# Patient Record
Sex: Female | Born: 1990 | Hispanic: Yes | Marital: Single | Smoking: Never smoker
Health system: Southern US, Community
[De-identification: ages and names within clinical notes are randomized; demographics above are authoritative.]

## PROBLEM LIST (undated history)

## (undated) ENCOUNTER — Inpatient Hospital Stay (HOSPITAL_COMMUNITY): Payer: Self-pay

## (undated) DIAGNOSIS — J302 Other seasonal allergic rhinitis: Secondary | ICD-10-CM

## (undated) DIAGNOSIS — K8020 Calculus of gallbladder without cholecystitis without obstruction: Secondary | ICD-10-CM

## (undated) HISTORY — PX: CHOLECYSTECTOMY: SHX55

---
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
## 2009-04-18 ENCOUNTER — Emergency Department (HOSPITAL_COMMUNITY): Admission: EM | Admit: 2009-04-18 | Discharge: 2009-04-18 | Payer: Self-pay | Admitting: Emergency Medicine

## 2009-09-19 ENCOUNTER — Emergency Department (HOSPITAL_COMMUNITY): Admission: EM | Admit: 2009-09-19 | Discharge: 2009-09-19 | Payer: Self-pay | Admitting: Emergency Medicine

## 2009-10-03 ENCOUNTER — Emergency Department (HOSPITAL_COMMUNITY): Admission: EM | Admit: 2009-10-03 | Discharge: 2009-10-03 | Payer: Self-pay | Admitting: Emergency Medicine

## 2009-12-25 ENCOUNTER — Encounter (INDEPENDENT_AMBULATORY_CARE_PROVIDER_SITE_OTHER): Payer: Self-pay | Admitting: Family Medicine

## 2009-12-25 ENCOUNTER — Ambulatory Visit: Payer: Self-pay | Admitting: Internal Medicine

## 2009-12-25 LAB — CONVERTED CEMR LAB
Basophils Absolute: 0.1 10*3/uL (ref 0.0–0.1)
Eosinophils Absolute: 0.2 10*3/uL (ref 0.0–0.7)
Eosinophils Relative: 3 % (ref 0–5)
HCT: 43.6 % (ref 36.0–46.0)
Hemoglobin: 14.9 g/dL (ref 12.0–15.0)
MCV: 92.4 fL (ref 78.0–100.0)
Monocytes Absolute: 0.6 10*3/uL (ref 0.1–1.0)
Neutro Abs: 4.1 10*3/uL (ref 1.7–7.7)
Neutrophils Relative %: 52 % (ref 43–77)
WBC: 7.8 10*3/uL (ref 4.0–10.5)

## 2010-01-01 ENCOUNTER — Ambulatory Visit (HOSPITAL_COMMUNITY): Admission: RE | Admit: 2010-01-01 | Discharge: 2010-01-01 | Payer: Self-pay | Admitting: Internal Medicine

## 2010-02-07 ENCOUNTER — Ambulatory Visit: Payer: Self-pay | Admitting: Obstetrics & Gynecology

## 2010-07-15 LAB — URINALYSIS, ROUTINE W REFLEX MICROSCOPIC
Bilirubin Urine: NEGATIVE
Bilirubin Urine: NEGATIVE
Ketones, ur: NEGATIVE mg/dL
Ketones, ur: NEGATIVE mg/dL
Nitrite: NEGATIVE
Protein, ur: NEGATIVE mg/dL
Specific Gravity, Urine: 1.024 (ref 1.005–1.030)
Urobilinogen, UA: 0.2 mg/dL (ref 0.0–1.0)
pH: 6.5 (ref 5.0–8.0)

## 2010-07-15 LAB — BASIC METABOLIC PANEL
BUN: 13 mg/dL (ref 6–23)
BUN: 8 mg/dL (ref 6–23)
CO2: 20 mEq/L (ref 19–32)
Calcium: 8.5 mg/dL (ref 8.4–10.5)
Calcium: 8.4 mg/dL (ref 8.4–10.5)
Chloride: 109 mEq/L (ref 96–112)
Creatinine, Ser: 0.59 mg/dL (ref 0.4–1.2)
Creatinine, Ser: 0.65 mg/dL (ref 0.4–1.2)
GFR calc non Af Amer: >60 mL/min (ref >60)
Potassium: 3.4 mEq/L — ABNORMAL LOW (ref 3.5–5.1)

## 2010-07-15 LAB — URINE MICROSCOPIC-ADD ON

## 2010-07-15 LAB — DIFFERENTIAL
Basophils Absolute: 0 10*3/uL (ref 0.0–0.1)
Basophils Relative: 0 % (ref 0–1)
Eosinophils Absolute: 0.3 10*3/uL (ref 0.0–0.7)
Eosinophils Relative: 3 % (ref 0–5)
Eosinophils Relative: 1 % (ref 0–5)
Lymphocytes Relative: 33 % (ref 12–46)
Monocytes Absolute: 0.4 10*3/uL (ref 0.1–1.0)
Monocytes Relative: 6 % (ref 3–12)
Neutro Abs: 6.5 10*3/uL (ref 1.7–7.7)
Neutro Abs: 9.9 10*3/uL — ABNORMAL HIGH (ref 1.7–7.7)
Neutrophils Relative %: 58 % (ref 43–77)

## 2010-07-15 LAB — CBC
Hemoglobin: 15.8 g/dL — ABNORMAL HIGH (ref 12.0–15.0)
MCHC: 35.2 g/dL (ref 30.0–36.0)
MCV: 90.1 fL (ref 78.0–100.0)
Platelets: 228 10*3/uL (ref 150–400)
Platelets: 172 10*3/uL (ref 150–400)
RBC: 4.32 MIL/uL (ref 3.87–5.11)
RBC: 4.96 MIL/uL (ref 3.87–5.11)
RDW: 12.7 % (ref 11.5–15.5)
WBC: 11.4 10*3/uL — ABNORMAL HIGH (ref 4.0–10.5)

## 2010-07-15 LAB — POCT PREGNANCY, URINE: Preg Test, Ur: NEGATIVE

## 2010-08-03 LAB — D-DIMER, QUANTITATIVE: D-Dimer, Quant: 0.73 ug/mL-FEU — ABNORMAL HIGH (ref 0.00–0.48)

## 2010-08-03 LAB — POCT I-STAT, CHEM 8
BUN: 15 mg/dL (ref 6–23)
Chloride: 107 mEq/L (ref 96–112)
Potassium: 3.8 mEq/L (ref 3.5–5.1)
Sodium: 138 mEq/L (ref 135–145)
TCO2: 21 mmol/L (ref 0–100)

## 2010-08-07 LAB — URINALYSIS, ROUTINE W REFLEX MICROSCOPIC
Glucose, UA: NEGATIVE mg/dL
Nitrite: NEGATIVE
Specific Gravity, Urine: 1.026 (ref 1.005–1.030)
pH: 7 (ref 5.0–8.0)

## 2010-08-07 LAB — DIFFERENTIAL
Eosinophils Absolute: 0.4 10*3/uL (ref 0.0–0.7)
Lymphocytes Relative: 34 % (ref 12–46)
Lymphs Abs: 3.2 10*3/uL (ref 0.7–4.0)
Monocytes Relative: 7 % (ref 3–12)
Neutrophils Relative %: 53 % (ref 43–77)

## 2010-08-07 LAB — COMPREHENSIVE METABOLIC PANEL
Albumin: 3.5 g/dL (ref 3.5–5.2)
Alkaline Phosphatase: 108 U/L (ref 39–117)
BUN: 8 mg/dL (ref 6–23)
Chloride: 107 mEq/L (ref 96–112)
GFR calc non Af Amer: >60 mL/min (ref >60)
Potassium: 3.3 mEq/L — ABNORMAL LOW (ref 3.5–5.1)
Total Bilirubin: <0.1 mg/dL — ABNORMAL LOW (ref 0.3–1.2)

## 2010-08-07 LAB — CBC
HCT: 36.5 % (ref 36.0–46.0)
Hemoglobin: 12.3 g/dL (ref 12.0–15.0)
Platelets: 236 10*3/uL (ref 150–400)
RBC: 4.69 MIL/uL (ref 3.87–5.11)
WBC: 9.4 10*3/uL (ref 4.0–10.5)

## 2010-08-07 LAB — LIPASE, BLOOD: Lipase: 26 U/L (ref 11–59)

## 2010-08-07 LAB — POCT PREGNANCY, URINE: Preg Test, Ur: NEGATIVE

## 2010-08-12 LAB — URINALYSIS, ROUTINE W REFLEX MICROSCOPIC
Bilirubin Urine: NEGATIVE
Nitrite: NEGATIVE
Specific Gravity, Urine: 1.015 (ref 1.005–1.030)
Urobilinogen, UA: 0.2 mg/dL (ref 0.0–1.0)
pH: 6.5 (ref 5.0–8.0)

## 2010-08-13 LAB — URINALYSIS, ROUTINE W REFLEX MICROSCOPIC
Hgb urine dipstick: NEGATIVE
Nitrite: NEGATIVE
Protein, ur: NEGATIVE mg/dL
Specific Gravity, Urine: 1.02 (ref 1.005–1.030)
Urobilinogen, UA: 0.2 mg/dL (ref 0.0–1.0)

## 2010-08-13 LAB — CROSSMATCH: ABO/RH(D): O POS

## 2010-08-13 LAB — CBC
MCHC: 32.4 g/dL (ref 30.0–36.0)
MCV: 79.3 fL (ref 78.0–100.0)
Platelets: 241 10*3/uL (ref 150–400)
RDW: 15.8 % — ABNORMAL HIGH (ref 11.5–15.5)
WBC: 10.4 10*3/uL (ref 4.0–10.5)

## 2010-08-13 LAB — RPR: RPR Ser Ql: NONREACTIVE

## 2010-09-10 NOTE — Op Note (Signed)
[name], [name] NO.:  [id number]   MEDICAL RECORD NO.:  [id number]          PATIENT TYPE:  INP   LOCATION:  6123                         FACILITY:  [facility]   PHYSICIAN:  Ollen Gross. Vernell Morgans, M.D. DATE OF BIRTH:  1990-07-16   DATE OF PROCEDURE:  09/19/2006  DATE OF DISCHARGE:  09/17/2006                               OPERATIVE REPORT   PREOPERATIVE DIAGNOSIS:  Gallstones.   POSTOPERATIVE DIAGNOSIS:  Gallstones.   PROCEDURE:  Laparoscopic cholecystectomy with intraoperative  cholangiogram.   SURGEON:  Ollen Gross. Vernell Morgans, M.D.   ASSISTANT:  Currie Paris, M.D.   ANESTHESIA:  General endotracheal.   PROCEDURE:  After informed consent was obtained, the patient was brought  to the operating room and placed in the supine position on the operating  room table.  After adequate induction of general anesthesia, the  patient's abdomen was prepped with Betadine and draped in the usual  sterile manner.  The area above the umbilicus was infiltrated with 0.25%  Marcaine.  A small incision was made with a 15 blade knife and this  incision was carried down through the subcutaneous tissue bluntly with a  hemostat and [employer] retractors until the linea alba was identified.  The linea alba was incised with a 15 blade knife.  Each side was grasped  Kocher clamps and elevated anteriorly.  The preperitoneal space was then  probed bluntly with a hemostat until the peritoneum was opened and  access was gained to the abdominal cavity.  A 0 Vicryl pursestring  stitch was placed in the fascia around the opening.  H Hasson cannula  was placed through the opening and anchored into place with the  previously-placed Vicryl pursestring stitch.  The abdomen was then  insufflated with carbon dioxide without difficulty.  The patient was  placed in a head-up position and rotated slightly with the right side  up.  Next the laparoscope was inserted through the Hasson cannula and  the  right upper quadrant was inspected.  The dome of the gallbladder and  liver were readily identified.  Next the epigastric region was  infiltrated with 0.25% Marcaine and a small incision was made with 15  blade knife and then a 10 mm port was placed bluntly through this  incision into the abdominal cavity under direct vision.  Sites were then  chosen laterally on the right side of the abdomen for placement of 5-mm  ports.  Each of these areas was infiltrated with 0.25% Marcaine and  small stab incisions were made with a 15 blade knife and 5 mm ports were  placed bluntly through these incisions into the abdominal cavity under  direct vision.  A blunt grasper was placed through the lateral-most 5 mm  port and used to grasp the dome of gallbladder and elevate it anteriorly  and superiorly.  Another blunt grasper was placed through the other 5 mm  port and used to retract on the body and neck of the gallbladder.  A  dissector was placed through the epigastric port and using  electrocautery, the peritoneal reflection was opened at  the gallbladder  neck.  Blunt dissection was then carried out in this area until the  gallbladder neck-cystic duct junction was readily identified and a good  window was created.  A single clip was placed on the gallbladder neck.  A small ductotomy was made just below the clip with a laparoscopic  scissors.  A 14-gauge Angiocath was then placed percutaneously through  the anterior abdominal wall under direct vision.  A Reddick  cholangiogram catheter was placed through the Angiocath and flushed.  The Reddick catheter was then placed within the cystic duct and anchored  in place with a clip.  A cholangiogram was obtained that showed no  filling defects, good emptying in the duodenum, and good length on the  cystic duct.  The anchoring clip and catheters were then removed from  the patient.  Three clips were placed proximally on the cystic duct and  the duct was divided  between the two sets of clips.  Posterior to this  the cystic artery was identified and again dissected bluntly in a  circumferential manner until a good window was created.  Two clips were  placed proximally and one distally on the artery and the artery was  divided between the two.  Next a laparoscopic hook cautery device was  used to separate the gallbladder from the liver bed.  Prior to  completely detaching the gallbladder from the liver bed, the liver bed  was inspected and several small bleeding points were coagulated with the  electrocautery until the area was completely hemostatic.  The  gallbladder was then detached the reset of the way from the liver bed  without difficulty.  A laparoscopic bag was inserted through the  epigastric port.  The gallbladder was placed within the bag, the bag was  sealed.  The abdomen was then irrigated with copious amounts of saline  until the effluent was clear.  The liver bed was inspected again and  found to be hemostatic.  The laparoscope was then moved to the  epigastric port.  A gallbladder grasper was placed through the Hasson  cannula and used to grasp the opening in the bag.  The bag with the  gallbladder was then removed through the supraumbilical port with the  Hasson cannula without difficulty.  The fascial defect was closed with  the previously-placed Vicryl pursestring stitch as well as with another  0 Vicryl interrupted stitch.  The rest of ports were removed under  direct vision and were found to be hemostatic.  Gas was allowed to  escape.  The skin incisions were all closed with interrupted 4-0  Monocryl subcuticular stitches.  Benzoin, Steri-Strips and sterile  dressings were applied.  The patient tolerated the procedure well.  At  the end of the case all needle, sponge and instrument counts were  correct.  The patient was then awakened and taken to the recovery room  in stable condition.      Ollen Gross. Vernell Morgans,  M.D. Electronically Signed     PST/MEDQ  D:  09/19/2006  T:  09/19/2006  Job:  [id number]

## 2010-09-10 NOTE — Consult Note (Signed)
NAMENICHOLL, ONSTOTT NO.:  [id number]   MEDICAL RECORD NO.:  [id number]          PATIENT TYPE:  INP   LOCATION:  6123                         FACILITY:  [facility]   PHYSICIAN:  Ollen Gross. Vernell Morgans, M.D. DATE OF BIRTH:  1990-10-12   DATE OF CONSULTATION:  09/18/2006  DATE OF DISCHARGE:                                 CONSULTATION   PRIMARY CARE PHYSICIAN:  [facility].   PRIMARY GASTROENTEROLOGIST:  Llana Aliment. Randa Evens, M.D.   REASON FOR CONSULTATION:  Cholelithiasis and common bile duct stone.   HISTORY OF PRESENT ILLNESS:  [name] is a 16-year Latina female 3  months postpartum who developed acute upper abdominal pain on Sep 13, 2006 mainly located in the epigastrium and right upper quadrant  associated nausea and vomiting, several hours of duration.  She  presented to the [facility]gent Care and was found to have transaminitis.  She  was sent over to [facility] for further evaluation.  An an ultrasound there  revealed common bile duct dilatation about 6 mm and positive stones.  She was subsequently admitted by gastroenterology services and underwent  an ERCP on Sep 18, 2006 with successful sphincterotomy and stone  retrieval.  Surgical consultation has been requested for  cholecystectomy.   REVIEW OF SYSTEMS:  Pain is currently resolved where prior it had been  unrelenting.  She is tender on palpation.  She reports several episodes  of some epigastric pain prior to this most recent problem.  Did not have  any problems during the pregnancy.  Most of these symptoms have occurred  since the baby was born.   SOCIAL HISTORY:  She is not married.  She does have a boyfriend who is  the father of her child.  She does live with him.  She does not attend  school.  She does not work.  She does not smoke cigarettes.  She does  not drink alcoholic beverages.  Her mother is here in the room with her.   PAST MEDICAL HISTORY:  None.   PAST SURGICAL HISTORY:  None.   ALLERGIES:  NO  KNOWN DRUG ALLERGIES.   CURRENT MEDICATIONS:  The patient is on IV fluids, IV Rocephin, IV  Dilaudid, and IV Phenergan as needed.   PHYSICAL EXAMINATION:  GENERAL:  Pleasant female patient who does  complain of some mild right upper quadrant pain but denies nausea and  vomiting.  VITAL SIGNS:  Temperature 97.2, BP 95/57, pulse 73, respirations 16.  NEUROLOGIC:  The patient is alert and oriented x3, moving all  extremities x4.  No focal deficits.  HEENT:  Head normocephalic.  Sclerae noninjected.  NECK:  Supple.  No adenopathy.  CHEST:  Bilateral lung sounds are clear to auscultation.  Respiratory  effort is nonlabored.  She is on room air.  CARDIAC:  S1 and S2.  No rubs, murmurs, thrills, or gallops.  She is on  IV fluids.  ABDOMEN:  Soft, somewhat distended.  She is postpartum, and this appears  be more related to recent post-pregnancy changes.  The abdomen is tender  in the right upper quadrant  with minimal guarding, no rebounding.  Bowel  sounds are present.  EXTREMITIES:  Symmetrical in appearance without  edema, cyanosis, or clubbing.   LABORATORY DATA:  Lipase has been normal.  Sodium 137, potassium 3.5,  CO2 24, BUN 4, creatinine 0.53.  Total bilirubin was 3.6 yesterday, now  1.5.  AST is 238, ALT is 521.  Yesterday, AST was 421, ALT was 685.  White count 7300, hemoglobin 11.1, platelets 249,000.   DIAGNOSTICS:  Abdominal ultrasound again reveals common bile duct  dilated to 6 mm.  Gallstones are present.  No pericholecystic fluid or  gallbladder wall thickening.  No Murphy sign on exam.   IMPRESSION:  1. Biliary colic secondary to retained common bile duct stone.  2. Status post endoscopic retrograde cholangiopancreatography,      sphincterotomy, and stone retrieval.  3. Transaminitis, improving.   PLAN:  Pending Dr. Billey Chang evaluation, the patient will probably undergo  laparoscopic cholecystectomy in the morning.  I briefly discussed the  procedure with the patient  and her mother, noting that although the  patient speaks [place], Spanish is the primary language that needs to be  utilized in discussing the surgical procedure.  Therefore, when Dr. Carolynne Edouard  spends more time with the family he will need to have a Spanish  translator available to go over the risks and benefits of the procedure.      Allison L. Rennis Harding, N.POllen Gross. Vernell Morgans, M.D.  Electronically Signed    ALE/MEDQ  D:  09/18/2006  T:  09/18/2006  Job:  [id number]   cc:   Fayrene Fearing L. Malon Kindle., M.D.

## 2010-09-10 NOTE — Op Note (Signed)
[name], SCHIER NO.:  [id number]   MEDICAL RECORD NO.:  [id number]          PATIENT TYPE:  INP   LOCATION:  6123                         FACILITY:  [facility]   PHYSICIAN:  Petra Kuba, M.D.    DATE OF BIRTH:  30-Jan-1991   DATE OF PROCEDURE:  DATE OF DISCHARGE:                               OPERATIVE REPORT   PROCEDURE:  Endoscopic retrograde cholangiopancreatography,  sphincterotomy and balloon pull-through.   INDICATION:  Probable CBD stone.  Consent was signed after risks,  benefits, methods, options thoroughly discussed with multiple family  members and they had a translated copy of an ERCP in Spanish.  We  discussed this with her father of the baby and her mother.   MEDICINES USED:  Fentanyl 150 mcg, Versed 14 mg.   PROCEDURE:  The side-viewing therapeutic video duodenoscope was inserted  by indirect vision into the stomach and advanced through a normal  antrum, normal pylorus, and a normal-appearing ampulla was brought into  view.  We were able to cannulate easily.  Unfortunately, on multiple  wire advances it appeared to be going into the PD.  We could not advance  the standard wire deep into the PD.  We did roll the patient on her left  side and try multiple other attempts when we were in different positions  but unable to advance the wire.  We did try a few minimal injections,  which just showed normal pancreatograms, only minimal PD injections were  done.  The injection was stopped as soon as we realized we were in an  PD.  We did switch to the smaller 0.025 wire in an effort to put it  deeper into the pancreas and place a pancreatic stent to decrease the  risk of pancreatitis and to ease in cannulation; however, unfortunately,  we could not advance the wire any further.  We changed the angle on the  sphincterotome and then were able to get deep selective cannulation,  advancing the wire into the intrahepatics.  The CBD was filled, which  was  normal.  The intrahepatics were normal.  We went ahead and proceeded  with a medium-sized sphincterotomy in the customary fashion until we  could get the fully-bowed sphincterotome in and out of the duct and  adequate biliary drainage.  We then proceeded with three 9-mm adjustable  balloon pull-throughs without obvious stones.  On the last one we  proceeded with an occlusion cholangiogram, which was normal.  The  balloons passed readily through the patent sphincterotomy site.  There  was adequate biliary drainage.  We elected to stop the procedure at this  juncture.  The patient tolerated the procedure well.   ENDOSCOPIC DIAGNOSES:  1. Normal ampulla.  2. Normal pancreatic duct with a few minimal injections, not      overfilled, and some wire placements, but unable to advance the      0.025 into the pancreatic duct in an effort to place the stent.  3. Normal common bile duct and intrahepatics, status post medium      sphincterotomy and three 9-mm balloon pull throughs  without obvious      stone.  4. Negative occlusion cholangiogram.   PLAN:  Observe for delayed complications.  If none, laparoscopic  cholecystectomy tomorrow.  Surgery has been notified.  Will follow labs  and observe for delayed complications.           ______________________________  Petra Kuba, M.D.     MEM/MEDQ  D:  09/18/2006  T:  09/18/2006  Job:  [id number]   cc:   Gabrielle Dare. Janee Morn, M.D.

## 2010-09-10 NOTE — Op Note (Signed)
NAMEVENIA, RIVERON              ACCOUNT NO.:  [id number]   MEDICAL RECORD NO.:  [id number]          PATIENT TYPE:  INP   LOCATION:  9139                          [facility]   PHYSICIAN:  Tanya S. Shawnie Pons, M.D.   DATE OF BIRTH:  [birth year]   DATE OF PROCEDURE:  06/14/2008  DATE OF DISCHARGE:                               OPERATIVE REPORT   PREOPERATIVE DIAGNOSES:  1. Intrauterine pregnancy at 40 plus weeks.  2. Breech presentation.  3. Declines external cephalic version.   POSTOPERATIVE DIAGNOSES:  1. Intrauterine pregnancy at 40 plus weeks.  2. Breech presentation.  3. Declines external cephalic version.   PROCEDURE:  Primary low transverse cesarean section.   ASSISTANT:  None.   ANESTHESIA:  Spinal and local.   FINDINGS:  Viable female infant,  Apgars 9 and 9.  Weight 7 pounds 14  ounces.   SPECIMENS:  Placenta to Labor and Delivery.   ESTIMATED BLOOD LOSS:  1000 mL.   COMPLICATIONS:  None known.   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  The patient was found  to be in a breech presentation.  She was counseled and declined external  cephalic version and opted for primary elective C-section.   PROCEDURE:  The patient was taken to the OR.  She was placed in a supine  position with a left lateral tilt.  After spinal anesthesia was  administered, she was prepped and draped in the usual sterile fashion.  A Foley catheter was placed inside the bladder.  When the anesthesia was  felt to be adequate via Allis testing, a Pfannenstiel incision was made  with a knife, carried down to underlying fascia which was divided in the  midline.  Fascial incision was extended laterally with Mayo scissors.  Rectus was then divided in the midline and the peritoneal cavity entered  bluntly.  The incision extended locally on the incision.   An Alexis  retractor was placed inside the incision.  A low transverse incision was  made on the uterus.  Amniotic cavity was entered with clear fluid noted.  Two feet were immediately visible at the incision, but the incision was  extended laterally with bandage scissors.  The infant was then delivered  via footling breech without difficulty.  There was a nuchal cord x1.  Infant had spontaneous crying on the abdomen.  Bulb suctioned and the  cord was clamped x2 and cut.  Infant was given to awaiting peds.  Cord  blood was obtained.  Infant was delivered without difficulty.  Uterine  cavity was cleaned with dry lap pads.  Uterine incision closed with 0  Vicryl suture in a locked running fashion.  Second imbricating layer of  0 Vicryl was then used.  Figure-of-eight was used on the left portion of  the incision to achieve hemostasis.  The incision was again inspected  and felt to be hemostatic throughout and the fascia was closed with 0  Vicryl suture  in running fashion.  The subcutaneous tissue was  cauterized and the bleeders were cauterized with the electrocautery and  skin closed using clips.  A 25 mL of 0.25% Marcaine were injected about  the incision.  All instrument, needle, and lap counts were correct x2.  Pressure dressing was applied.  The patient was awakened and taken to  the recovery room in stable condition.      Shelbie Proctor. Shawnie Pons, M.D.  Electronically Signed     TSP/MEDQ  D:  06/14/2008  T:  06/14/2008  Job:  [id number]

## 2010-09-10 NOTE — H&P (Signed)
NAMEELISSA, GRIESHOP              ACCOUNT NO.:  [id number]   MEDICAL RECORD NO.:  [id number]          PATIENT TYPE:  INP   LOCATION:  6123                         FACILITY:  [facility]   PHYSICIAN:  James L. Malon Kindle., M.D.DATE OF BIRTH:  August 16, 1990   DATE OF ADMISSION:  09/17/2006  DATE OF DISCHARGE:                              HISTORY & PHYSICAL   REFERR[facility]YSIC[facility]   REASON FOR ADMISSION:  Gallstones and probable common duct stone.   HISTORY OF PRESENT ILLNESS:  A 20 year old, [place] female who is 3  months postpartum.  She had done well until 4 days ago when she began to  have nausea and vomiting after eating and right upper quadrant abdominal  pain.  The symptoms got progressively worse and she came into the urgent  care center.  The pain goes through to her back.  She has had no fever  or chills, etc..  Her lab work revealed a total bilirubin 3.6, AST of  421 and ALT is 685.  White count was normal.  Ultrasound showed  gallstones with a 6-7 mm common duct.  The patient is somewhat  dehydrated and is unable to keep down liquids.   CURRENT MEDICATIONS:  None.   ALLERGIES:  No known drug allergies.   MEDICAL HISTORY:  No chronic medical problems.  She has had bronchitis  in the past.  No previous surgeries.   FAMILY HISTORY:  Negative for gallstones or liver disease.   SOCIAL HISTORY:  She is single and just had a baby.  She is here today  with her boyfriend.  She has been in the [place] for 6 years.   REVIEW OF SYSTEMS:  Two Tylenol 4 days ago.  No over-the-counter  medicines or herbs.   PHYSICAL EXAMINATION:  VITAL SIGNS:  The patient is afebrile.  Vital  signs are normal.  Pulses 87, blood pressure 103/68.  GENERAL:  An alert and oriented [place] female.  Sclerae anicteric.  Mucous membranes dry.  LUNGS:  Clear.  HEART:  Regular rate and rhythm without murmurs or gallops.  ABDOMEN:  Silent with a few bowel sounds.  Exquisite tenderness in the  right upper quadrant, less tenderness in other areas.   ASSESSMENT:  Gallstones with probable common duct stone.   PLAN:  Will admit, give IV antibiotic and plan an ERCP tomorrow.  I have  discussed the risks and benefits with her.  I think she understands Dr.  Ewing Schlein will perform the procedure.  She will likely need a  cholecystectomy at some point afterwards.           ______________________________  Llana Aliment Malon Kindle., M.D.     Waldron Session  D:  09/17/2006  T:  09/18/2006  Job:  [id number]

## 2010-09-10 NOTE — Discharge Summary (Signed)
[name], [name]              ACCOUNT NO.:  [id number]   MEDICAL RECORD NO.:  [id number]          PATIENT TYPE:  INP   LOCATION:  9139                          [facility]   PHYSICIAN:  Tanya S. Shawnie Pons, M.D.   DATE OF BIRTH:  1991-04-11   DATE OF ADMISSION:  06/14/2008  DATE OF DISCHARGE:  06/17/2008                               DISCHARGE SUMMARY   DISCHARGE DIAGNOSIS:  Primary low transverse cesarean section for breech  presentation.   DISCHARGE MEDICATIONS:  1. Percocet 5/325 mg 1 tablet p.o. q.6 h. p.r.n. pain.  2. Prenatal vitamin 1 tablet p.o. daily while breastfeeding.  3. Ibuprofen 600 mg 1 tablet p.o. q.6 h. p.r.n. pain.  4. Colace 100 mg 1 tablet p.o. b.i.d. p.r.n. postpartum constipation.   PROCEDURES:  Primary low transverse cesarean section performed on  June 14, 2008.   LABORATORY DATA:  1. CBC on June 14, 2008:  WBC 10.4, hemoglobin 10.1, hematocrit      31.4, and platelet count 241.  2. RPR nonreactive.  3. CBC on June 15, 2008:  WBC 13.4, hemoglobin 8.1, hematocrit      25.1, and platelet count 206.   BRIEF HOSPITAL COURSE:  The patient is an 20 year old [place] female  admitted in Labor who had a primary low transverse cesarean section for  breech presentation.  1. Primary low transverse cesarean section:  The patient was admitted      on June 14, 2008, with spontaneous onset of labor.  The patient      had an intrauterine pregnancy at 40 plus weeks.  The patient was      noted to have a baby in breech presentation.  The patient was      therefore taken to the operating room on June 14, 2008.  A      primary low transverse cesarean section was performed by Dr. Shawnie Pons      with spinal and local anesthesia.  Findings include a 7 pounds 14      ounces female with Apgars of 9 and 9.  Placenta was manually      delivered.  Estimated blood loss was 1000 mL.  There were no      immediate complications.  The patient was sent to PACU in good      condition.  After the procedure, the patient did well, and she      tolerated the procedure well.  The patient's abdominal pain      improved throughout her hospital stay as well as her bleeding.  Of      note, on postop day #2, the patient did have a slight fever, low-      grade fever of 100.8.  The patient did not appear to have any      active signs of infection.  Fever resolved with ibuprofen.  On the      day of discharge, the patient was had been afebrile for greater      than 24 hours.  She was ambulating, voiding, had a bowel movement,      and had no other  complaints.  The patient plans on using Depo and      then Implanon for birth control.  The patient is going to breast      and bottle feed.  The patient was in complete understanding and      agreement with discharge.   DISCHARGE INSTRUCTIONS:  The patient should have pelvic rest for 6  weeks.  The patient should increase activity slowly.  The patient has no  restrictions on her diet.   FOLLOWUP:  The patient should follow up with [facility]  in 6 weeks for routine postpartum care.   DISCHARGE CONDITION:  Good.      Angelena Sole, MD      Shelbie Proctor. Shawnie Pons, M.D.  Electronically Signed    WS/MEDQ  D:  06/17/2008  T:  06/17/2008  Job:  [id number]

## 2010-09-13 NOTE — Discharge Summary (Signed)
[name], [name]              ACCOUNT NO.:  [id number]   MEDICAL RECORD NO.:  [id number]          PATIENT TYPE:  INP   LOCATION:  6123                         FACILITY:  [facility]   PHYSICIAN:  James L. Malon Kindle., M.D.DATE OF BIRTH:  12/13/1990   DATE OF ADMISSION:  09/17/2006  DATE OF DISCHARGE:  09/20/2006                               DISCHARGE SUMMARY   REASON FOR ADMISSION:  Gallstones and common duct stone.   FINAL DIAGNOSIS:  Gallstones and common duct stone.   PROCEDURES:  1. ERCP with sphincterotomy and stone extraction by Dr. Ewing Schlein on Sep 18, 2006.  2. Cholecystectomy by Dr. Carolynne Edouard on Sep 19, 2006.   BRIEF HISTORY:  A young, healthy 20 year old admitted with abdominal  pain.  Found to have gallstones and jaundice.  She was admitted to the  hospital and arrangements were made the following day for her to undergo  an ERCP by Dr. Ewing Schlein.  Dr. Ewing Schlein performed the ERCP with sphincterotomy  and wound pull-through with no obvious stones and a wildly draining  duct.  She was seen in consultation by [facility], and on the following day underwent a laparoscopic  cholecystectomy by Dr. Carolynne Edouard.  She did well following this and was  discharged home in much improved condition.  Discharge was made by phone  by [facility] and instructions were for her to call Dr.  Carolynne Edouard for an appointment.           ______________________________  Llana Aliment. Malon Kindle., M.D.     Waldron Session  D:  12/21/2006  T:  12/22/2006  Job:  [id number]

## 2011-01-23 LAB — URINALYSIS, ROUTINE W REFLEX MICROSCOPIC
Bilirubin Urine: NEGATIVE
Leukocytes, UA: NEGATIVE
Nitrite: NEGATIVE
Protein, ur: NEGATIVE
Specific Gravity, Urine: 1.015
Urobilinogen, UA: 0.2
Urobilinogen, UA: 1

## 2011-01-23 LAB — WET PREP, GENITAL: Clue Cells Wet Prep HPF POC: NONE SEEN

## 2011-01-23 LAB — GC/CHLAMYDIA PROBE AMP, GENITAL
Chlamydia, DNA Probe: NEGATIVE
GC Probe Amp, Genital: NEGATIVE

## 2011-01-23 LAB — URINE MICROSCOPIC-ADD ON

## 2011-01-23 LAB — HCG, QUANTITATIVE, PREGNANCY: hCG, Beta Chain, Quant, S: 19351 — ABNORMAL HIGH

## 2011-01-31 LAB — URINALYSIS, ROUTINE W REFLEX MICROSCOPIC
Glucose, UA: NEGATIVE mg/dL
Hgb urine dipstick: NEGATIVE
Ketones, ur: NEGATIVE mg/dL
Protein, ur: NEGATIVE mg/dL

## 2011-02-04 LAB — WET PREP, GENITAL: Trich, Wet Prep: NONE SEEN

## 2011-02-04 LAB — POCT URINALYSIS DIP (DEVICE)
Hgb urine dipstick: NEGATIVE
Ketones, ur: NEGATIVE
Protein, ur: NEGATIVE
Specific Gravity, Urine: >=1.03
pH: 6

## 2011-02-04 LAB — GC/CHLAMYDIA PROBE AMP, GENITAL: Chlamydia, DNA Probe: NEGATIVE

## 2011-09-08 ENCOUNTER — Encounter (HOSPITAL_COMMUNITY): Payer: Self-pay | Admitting: Emergency Medicine

## 2011-09-08 ENCOUNTER — Emergency Department (HOSPITAL_COMMUNITY)
Admission: EM | Admit: 2011-09-08 | Discharge: 2011-09-08 | Disposition: A | Discharge status: Home / Self Care | Payer: Self-pay | Attending: Emergency Medicine | Admitting: Emergency Medicine

## 2011-09-08 DIAGNOSIS — R109 Unspecified abdominal pain: Secondary | ICD-10-CM | POA: Insufficient documentation

## 2011-09-08 DIAGNOSIS — R197 Diarrhea, unspecified: Secondary | ICD-10-CM | POA: Insufficient documentation

## 2011-09-08 DIAGNOSIS — R300 Dysuria: Secondary | ICD-10-CM | POA: Insufficient documentation

## 2011-09-08 DIAGNOSIS — R112 Nausea with vomiting, unspecified: Secondary | ICD-10-CM | POA: Insufficient documentation

## 2011-09-08 DIAGNOSIS — J45909 Unspecified asthma, uncomplicated: Secondary | ICD-10-CM | POA: Insufficient documentation

## 2011-09-08 DIAGNOSIS — R350 Frequency of micturition: Secondary | ICD-10-CM | POA: Insufficient documentation

## 2011-09-08 HISTORY — DX: Calculus of gallbladder without cholecystitis without obstruction: K80.20

## 2011-09-08 HISTORY — DX: Other seasonal allergic rhinitis: J30.2

## 2011-09-08 LAB — BASIC METABOLIC PANEL
BUN: 16 mg/dL (ref 6–23)
Calcium: 9.1 mg/dL (ref 8.4–10.5)
Creatinine, Ser: 0.55 mg/dL (ref 0.50–1.10)
GFR calc Af Amer: >90 mL/min (ref >90)
GFR calc non Af Amer: >90 mL/min (ref >90)
Potassium: 3.9 mEq/L (ref 3.5–5.1)

## 2011-09-08 LAB — DIFFERENTIAL
Basophils Relative: 0 % (ref 0–1)
Eosinophils Absolute: 0.5 10*3/uL (ref 0.0–0.7)
Monocytes Absolute: 0.7 10*3/uL (ref 0.1–1.0)
Monocytes Relative: 7 % (ref 3–12)
Neutrophils Relative %: 56 % (ref 43–77)

## 2011-09-08 LAB — CBC
Hemoglobin: 13.9 g/dL (ref 12.0–15.0)
MCH: 31.7 pg (ref 26.0–34.0)
MCHC: 36 g/dL (ref 30.0–36.0)

## 2011-09-08 LAB — URINALYSIS, ROUTINE W REFLEX MICROSCOPIC
Bilirubin Urine: NEGATIVE
Ketones, ur: 15 mg/dL — AB
Nitrite: NEGATIVE
pH: 6 (ref 5.0–8.0)

## 2011-09-08 LAB — WET PREP, GENITAL

## 2011-09-08 MED ORDER — ONDANSETRON 4 MG PO TBDP
8.0000 mg | ORAL_TABLET | Freq: Once | ORAL | Status: AC
  Administered 2011-09-08: 8 mg via ORAL
  Filled 2011-09-08: qty 2

## 2011-09-08 MED ORDER — HYDROCODONE-ACETAMINOPHEN 5-500 MG PO TABS: 1.0000 | ORAL_TABLET | Freq: Four times a day (QID) | ORAL | Status: DC | PRN

## 2011-09-08 MED ORDER — IBUPROFEN 600 MG PO TABS: 600.0000 mg | ORAL_TABLET | Freq: Three times a day (TID) | ORAL | Status: AC | PRN

## 2011-09-08 MED ORDER — METRONIDAZOLE 500 MG PO TABS: 500.0000 mg | ORAL_TABLET | Freq: Two times a day (BID) | ORAL | Status: DC

## 2011-09-08 MED ORDER — OXYCODONE-ACETAMINOPHEN 5-325 MG PO TABS
1.0000 | ORAL_TABLET | Freq: Once | ORAL | Status: AC
  Administered 2011-09-08: 1 via ORAL
  Filled 2011-09-08: qty 1

## 2011-09-08 NOTE — Discharge Instructions (Signed)
Dolor abdominal, versin ampliada (Abdominal Pain, Nonspecific) El anlisis podra no mostrar la razn exacta por la que tiene dolor abdominal. Debido a que hay muchas causas distintas de dolor abdominal, se podr necesitar otro control y ms anlisis. Es muy importante el seguimiento para observar los sntomas duraderos (persistentes) o los que empeoran. Una causa posible de dolor abdominal en cualquier persona que an tiene su apndice es la apendicitis aguda. La apendicitis es a menudo difcil de diagnosticar. Los anlisis de sangre, orina, ultrasonido y tomografa computada no pueden descartar por completo la apendicitis u otra causas de dolor abdominal. A veces, slo los cambios que se producen a travs del tiempo permitirn determinar si el dolor abdominal se debe al apendicitis o a otras causas. Otros problemas potenciales que pueden requerir ciruga tambin pueden tomar algn tiempo hasta ser evidentes. Debido a esto, es importante seguir todas las instrucciones de ms abajo. INSTRUCCIONES PARA EL CUIDADO DOMICILIARIO  Descanse todo lo que pueda.   No ingiera alimentos slidos hasta que el dolor desaparezca.   Cuando un adulto o un nio siente dolor: Puede beneficiarlo una dieta basada en agua, t liviano descafeinado, caldo o consom, gelatina, solucin de rehidratacin oral, helados de agua o trocitos de hielo.   Cuando el adulto o el nio no sienten ms dolor: Consuma una dieta liviana (tostadas secas, crackers, jugo de manzana o arroz blanco). Incorpore ms alimentos lentamente, siempre que esto no le cause ningn trastorno. No consuma productos lcteos (incluyendo queso y huevos) ni ingiera alimentos condimentados, grasos, fritos o con gran cantidad de fibra.   No consuma alcohol, cafena ni cigarrillos.   Tome sus medicamentos regularmente, excepto que el profesional le indique lo contrario.   Utilice los medicamentos de venta libre o de prescripcin para el dolor, el malestar o la  fiebre, segn se lo indique el profesional que lo asiste.   Utilice los medicamentos de venta libre o de prescripcin para el dolor, el malestar o la fiebre, segn se lo indique el profesional que lo asiste. No administre aspirina a los nios.  Si el mdico le ha dado fecha para una visita de control, es importante que concurra. No cumplir con este control puede dar como resultado que el dao, el dolor o la discapacidad sean permanentes (crnicos). Si tiene problemas para asistir al control, deber comunicarlo en este establecimiento para recibir asesoramiento.  SOLICITE ATENCIN MDICA DE INMEDIATO SI:  Usted o su nio han sufrido dolor por ms de 24 horas.   El dolor empeora, cambia de lugar o se siente diferente.   Usted o su nio tienen una temperatura oral de ms de 102 F (38.9 C) y no puede ser controlada con medicamentos.   Su beb tiene ms de 3 meses y su temperatura rectal es de 102 F (38.9 C) o ms.   Su beb tiene 3 meses o menos y su temperatura rectal es de 100.4 F (38 C) o ms.   Usted o su hijo tienen escalofros.   Continan con vmitos y no pueden retener lquidos.   Observa sangre en el vmito o en la materia fecal.   Las heces son oscuras o negras.   Los movimientos intestinales son frecuentes.   Los movimientos intestinales se detienen (hay una obstruccin) o no pueden eliminarse los gases.   Siente dolor al orinar o lo hace con frecuencia u observa sangre en la orina.   La piel y la zona blanca de los ojos cambian de color y se tornan amarillos.     Observa que el estmago se hincha o est ms grande.   Sienten mareos o desmayos.   Sienten dolor en el pecho o la espalda.  EST SEGURO QUE:   Comprende las instrucciones para el alta mdica.   Controlar su enfermedad.   Solicitar atencin mdica de inmediato segn las indicaciones.  Document Released: 07/22/2007 Document Revised: 04/03/2011 [employer] Patient Information 2012 [employer]. 

## 2011-09-08 NOTE — ED Notes (Signed)
PT. REPORTS DYSURIA WITH LOW ABDOMINAL PAIN AND VOMITTING FOR SEVERAL DAYS , OCCASIONAL HEADACHE .

## 2011-09-09 NOTE — ED Provider Notes (Signed)
History     CSN: [id number]  Arrival date & time 09/08/11  0044   First MD Initiated Contact with Patient 09/08/11 0157      Chief Complaint  Patient presents with  . Dysuria     The history is provided by the patient.   the patient for several days of mild lower abdominal pain without diarrhea.  She reports some nausea and vomiting.  She denies significant discomfort at this time.  She does report some urinary frequency and dysuria.  No new vaginal discharge or vaginal complaints.  No vaginal bleeding.  Her symptoms are mild to moderate in severity.  Nothing worsens her symptoms.  Nothing improves her symptoms.  Her symptoms are constant yet improving  Past Medical History  Diagnosis Date  . Asthma   . Seasonal allergies   . Gall stones     History reviewed. No pertinent past surgical history.  No family history on file.  History  Substance Use Topics  . Smoking status: Never Smoker   . Smokeless tobacco: Not on file  . Alcohol Use: No    OB History    Grav Para Term Preterm Abortions TAB SAB Ect Mult Living                  Review of Systems  Genitourinary: Positive for dysuria.  All other systems reviewed and are negative.    Allergies  Review of patient's allergies indicates no known allergies.  Home Medications   Current Outpatient Rx  Name Route Sig Dispense Refill  . ALBUTEROL SULFATE HFA 108 (90 BASE) MCG/ACT IN AERS Inhalation Inhale 2 puffs into the lungs every 6 (six) hours as needed. For shortness of breath    . ADULT MULTIVITAMIN W/MINERALS CH Oral Take 1 tablet by mouth daily.    [name] HYDROCODONE-ACETAMINOPHEN 5-500 MG PO TABS Oral Take 1 tablet by mouth every 6 (six) hours as needed for pain. 10 tablet 0  . IBUPROFEN 600 MG PO TABS Oral Take 1 tablet (600 mg total) by mouth every 8 (eight) hours as needed for pain. 15 tablet 0    BP 96/61  Pulse 84  Temp(Src) 98.3 F (36.8 C) (Oral)  Resp 16  SpO2 94%  LMP 08/25/2011  Physical Exam    Nursing note and vitals reviewed. Constitutional: She is oriented to person, place, and time. She appears well-developed and well-nourished. No distress.  HENT:  Head: Normocephalic and atraumatic.  Eyes: EOM are normal.  Neck: Normal range of motion.  Cardiovascular: Normal rate, regular rhythm and normal heart sounds.   Pulmonary/Chest: Effort normal and breath sounds normal.  Abdominal: Soft. She exhibits no distension. There is no tenderness.  Genitourinary:       Normal external genitalia.  Cervix is normal and not inflamed.  No cervical motion tenderness.  No adnexal fullness or masses.  No vaginal discharge or bleeding noted  Musculoskeletal: Normal range of motion.  Neurological: She is alert and oriented to person, place, and time.  Skin: Skin is warm and dry.  Psychiatric: She has a normal mood and affect. Judgment normal.    ED Course  Procedures (including critical care time)  Labs Reviewed  URINALYSIS, ROUTINE W REFLEX MICROSCOPIC - Abnormal; Notable for the following:    Color, Urine STRAW (*)    Ketones, ur 15 (*)    All other components within normal limits  WET PREP, GENITAL - Abnormal; Notable for the following:    Clue Cells Wet Prep HPF  POC FEW (*)    WBC, Wet Prep HPF POC MODERATE (*)    All other components within normal limits  CBC  DIFFERENTIAL  BASIC METABOLIC PANEL  POCT PREGNANCY, URINE  GC/CHLAMYDIA PROBE AMP, GENITAL   No results found.   1. Abdominal pain       MDM  Unclear etiology of the abdominal pain.  A repeat examination she is nontender in her abdomen.  Her pelvic is without significant abnormalities.  Discharge home with a short course pain medicine and instructions to return to the ER for new or worsening symptoms.         Lyanne Co, MD 09/09/11 [phone]

## 2011-09-17 ENCOUNTER — Inpatient Hospital Stay (HOSPITAL_COMMUNITY)
Admission: AD | Admit: 2011-09-17 | Discharge: 2011-09-18 | Disposition: A | Discharge status: Home / Self Care | Payer: Self-pay | Source: Ambulatory Visit | Attending: Obstetrics & Gynecology | Admitting: Obstetrics & Gynecology

## 2011-09-17 DIAGNOSIS — R109 Unspecified abdominal pain: Secondary | ICD-10-CM | POA: Insufficient documentation

## 2011-09-17 DIAGNOSIS — N923 Ovulation bleeding: Secondary | ICD-10-CM

## 2011-09-17 DIAGNOSIS — N949 Unspecified condition associated with female genital organs and menstrual cycle: Secondary | ICD-10-CM | POA: Insufficient documentation

## 2011-09-17 DIAGNOSIS — N921 Excessive and frequent menstruation with irregular cycle: Secondary | ICD-10-CM

## 2011-09-17 DIAGNOSIS — N938 Other specified abnormal uterine and vaginal bleeding: Secondary | ICD-10-CM | POA: Insufficient documentation

## 2011-09-18 ENCOUNTER — Encounter (HOSPITAL_COMMUNITY): Payer: Self-pay | Admitting: *Deleted

## 2011-09-18 LAB — URINALYSIS, ROUTINE W REFLEX MICROSCOPIC
Leukocytes, UA: NEGATIVE
Nitrite: NEGATIVE
Specific Gravity, Urine: >1.03 — ABNORMAL HIGH (ref 1.005–1.030)
pH: 6 (ref 5.0–8.0)

## 2011-09-18 LAB — URINE MICROSCOPIC-ADD ON

## 2011-09-18 LAB — WET PREP, GENITAL

## 2011-09-18 LAB — HCG, SERUM, QUALITATIVE: Preg, Serum: NEGATIVE

## 2011-09-18 NOTE — MAU Provider Note (Signed)
[name] [id number] @Unknown  by LMP Chief Complaint  Patient presents with  . Vaginal Bleeding  . Abdominal Cramping     First Provider Initiated Contact with Patient 09/18/11 0030      SUBJECTIVE  HPI: Pt presents to MAU with vaginal bleeding and lower abdominal cramping starting this morning. She had a positive home pregnancy test 1 week ago.  Patient's last menstrual period was 08/21/2011.  She reports that urine pregnancy tests have been negative before at the hospital when she was pregnant and she is concerned about this.  She had Implanon out recently and is planning Depo Provera shot soon.  She denies vaginal itching/burning, urinary symptoms, h/a, dizziness, n/v, or fever/chills.    Past Medical History  Diagnosis Date  . Asthma   . Seasonal allergies   . Gall stones    Past Surgical History  Procedure Date  . Cholecystectomy    History   Social History  . Marital Status: Divorced    Spouse Name: N/A    Number of Children: N/A  . Years of Education: N/A   Occupational History  . Not on file.   Social History Main Topics  . Smoking status: Never Smoker   . Smokeless tobacco: Not on file  . Alcohol Use: No  . Drug Use: No  . Sexually Active: Yes    Birth Control/ Protection: None   Other Topics Concern  . Not on file   Social History Narrative  . No narrative on file   No current facility-administered medications on file prior to encounter.   Current Outpatient Prescriptions on File Prior to Encounter  Medication Sig Dispense Refill  . albuterol (PROVENTIL HFA;VENTOLIN HFA) 108 (90 BASE) MCG/ACT inhaler Inhale 2 puffs into the lungs every 6 (six) hours as needed. For shortness of breath      . HYDROcodone-acetaminophen (VICODIN) 5-500 MG per tablet Take 1 tablet by mouth every 6 (six) hours as needed for pain.  10 tablet  0  . ibuprofen (ADVIL,MOTRIN) 600 MG tablet Take 1 tablet (600 mg total) by mouth every 8 (eight) hours as needed for  pain.  15 tablet  0  . Multiple Vitamin (MULITIVITAMIN WITH MINERALS) TABS Take 1 tablet by mouth daily.       No Known Allergies  ROS: Pertinent items in HPI  OBJECTIVE Blood pressure 110/72, pulse 83, temperature 98.6 F (37 C), temperature source Oral, resp. rate 16, height 5\' 2"  (1.575 m), weight 71.668 kg (158 lb), last menstrual period 08/21/2011.  GENERAL: Well-developed, well-nourished female in no acute distress.  HEENT: Normocephalic, good dentition HEART: normal rate RESP: normal effort ABDOMEN: Soft, nontender EXTREMITIES: Nontender, no edema NEURO: Alert and oriented Pelvic exam: Cervix pink, visually closed, without lesion, scant dark brown blood, vaginal walls and external genitalia normal Bimanual exam: Cervix 0/long/high, firm, anterior, neg CMT, uterus nontender, nonenlarged, adnexa without tenderness, enlargement, or mass   LAB RESULTS Results for orders placed during the hospital encounter of 09/17/11 (from the past 24 hour(s))  URINALYSIS, ROUTINE W REFLEX MICROSCOPIC     Status: Abnormal   Collection Time   09/18/11 12:00 AM      Component Value Range   Color, Urine YELLOW  YELLOW    APPearance CLEAR  CLEAR    Specific Gravity, Urine >1.030 (*) 1.005 - 1.030    pH 6.0  5.0 - 8.0    Glucose, UA NEGATIVE  NEGATIVE (mg/dL)   Hgb urine dipstick LARGE (*) NEGATIVE  Bilirubin Urine NEGATIVE  NEGATIVE    Ketones, ur NEGATIVE  NEGATIVE (mg/dL)   Protein, ur NEGATIVE  NEGATIVE (mg/dL)   Urobilinogen, UA 0.2  0.0 - 1.0 (mg/dL)   Nitrite NEGATIVE  NEGATIVE    Leukocytes, UA NEGATIVE  NEGATIVE   URINE MICROSCOPIC-ADD ON     Status: Normal   Collection Time   09/18/11 12:00 AM      Component Value Range   Squamous Epithelial / LPF RARE  RARE    RBC / HPF 3-6  <3 (RBC/hpf)   Bacteria, UA RARE  RARE   POCT PREGNANCY, URINE     Status: Normal   Collection Time   09/18/11 12:20 AM      Component Value Range   Preg Test, Ur NEGATIVE  NEGATIVE   HCG, SERUM,  QUALITATIVE     Status: Normal   Collection Time   09/18/11  1:05 AM      Component Value Range   Preg, Serum NEGATIVE  NEGATIVE   WET PREP, GENITAL     Status: Abnormal   Collection Time   09/18/11  1:15 AM      Component Value Range   Yeast Wet Prep HPF POC NONE SEEN  NONE SEEN    Trich, Wet Prep NONE SEEN  NONE SEEN    Clue Cells Wet Prep HPF POC NONE SEEN  NONE SEEN    WBC, Wet Prep HPF POC FEW (*) NONE SEEN      ASSESSMENT Menses vs SAB   PLAN D/C home Pt to f/u with Depo Provera as scheduled May take ibuprofen for cramping Return to MAU as needed   LEFTWICH-KIRBY, Anikah Hogge 09/18/2011 12:31 AM

## 2011-09-18 NOTE — MAU Note (Signed)
Pt G2 P2, LMP 4/25, +UPT at home.  Having small amt of bleeding and cramping.

## 2011-09-18 NOTE — Discharge Instructions (Signed)
Menstruation °Menstruation is the monthly passing of blood, tissue, fluid and mucus, also know as a period. Your body is shedding the lining of the uterus. The flow, or amount of blood, usually lasts from 3 to 7 days each month. Hormones control the menstrual cycle. Hormones are a chemical substance produced by endocrine glands in the body to regulate different bodily functions. °The first menstrual period may start any time between age 22 to 16 years. However, it usually starts around age 11 or 12. Some girls have regular monthly menstrual cycles right from the beginning. However, it is not unusual to have only a couple of drops of blood or spotting when you first start menstruating. It is also not unusual to have two periods a month or miss a month or two when first starting your periods. °SYMPTOMS  °· Mild to moderate abdominal cramps.  °· Aching or pain in the lower back area.  °Symptoms that may occur 5 to 10 days before your menstrual period starts, which is referred to as premenstrual syndrome (PMS). These symptoms can include: °· Headache.  °· Breast tenderness and swelling.  °· Bloating.  °· Tiredness (fatigue).  °· Mood changes.  °· Craving for certain foods.  °These are normal signs and symptoms and can vary in severity. To help relieve these problems, ask your caregiver if you can take over-the-counter medications for pain or discomfort. If the symptoms are not controllable, see your caregiver for help.  °HORMONES INVOLVED IN MENSTRUATION °Menstruation comes about because of hormones produced by the pituitary gland in the brain and the ovaries that affect the uterine lining. °First, the pituitary gland in the brain produces the hormone Follicle Stimulating Hormone (FSH). FSH stimulates the ovaries to produce estrogen, which thickens the uterine lining and begins to develop an egg in the ovary. About 14 days later, the pituitary gland produces another hormone called Luteinizing Hormone (LH). LH causes the  egg to come out of a sac in the ovary (ovulation). The empty sac on the ovary called the corpus luteum is stimulated by another hormone from the pituitary gland called luteotropin. The corpus luteum begins to produce the estrogen and progesterone hormone. The progesterone hormone prepares the lining of the uterus to have the fertilized egg (egg and sperm) attach to the lining of the uterus and begin to develop into a fetus. If the egg is not fertilized, the corpus luteum stops producing estrogen and progesterone, it disappears, the lining of the uterus sloughs off and a menstrual period begins. Then the menstrual cycle starts all over again and will continue monthly unless pregnancy occurs or menopause begins. °The secretion of hormones is complex. Various parts of the body become involved in many chemical activities. Female sex hormones have other functions in a woman's body as well. Estrogen increases a woman's sex drive (libido). It naturally helps body get rid of fluids (diuretic). It also aids in the process of building new bone. Therefore, maintaining hormonal health is essential to all levels of a woman's well being. These hormones are usually present in normal amounts and cause you to menstruate. It is the relationship between the (small) levels of the hormones that is critical. When the balance is upset, menstrual irregularities can occur. °HOW DOES THE MENSTRUAL CYCLE HAPPEN? °· Menstrual cycles vary in length from 21 to 35 days with an average of 29 days. The cycle begins on the first day of bleeding. At this time, the pituitary gland in the brain releases FSH that travels   through the bloodstream to the ovaries. The [facility] stimulates the follicles in the ovaries. This prepares the body for ovulation that occurs around the 14th day of the cycle. The ovaries produce estrogen, and this makes sure conditions are right in the uterus for implantation of the fertilized egg.   When the levels of estrogen reach a  high enough level, it signals the gland in the brain (pituitary gland) to release a surge of LH. This causes the release of the ripest egg from its follicle (ovulation). Usually only one follicle releases one egg, but sometimes more than one follicle releases an egg especially when stimulating the ovaries for invitro fertilization. The egg can then be collected by either fallopian tube to await fertilization. The burst follicle within the ovary that is left behind is now called the corpus luteum or "yellow body." The corpus luteum continues to give off (secrete) reduced amounts of estrogen. This closes and hardens the cervix. It driesup the mucus to the naturally infertile condition.   The corpus luteum also begins to give off greater amounts of progesterone. This causes the lining of the uterus (endometrium) to thicken even more in preparation for the fertilized egg. The egg is starting to journey down from the fallopian tube to the uterus. It also signals the ovaries to stop releasing eggs. It assists in returning the cervical mucus to its infertile state.   If the egg implants successfully into the womb lining and pregnancy occurs, progesterone levels will continue to raise. It is often this hormone that gives some pregnant women a feeling of well being, like a "natural high." Progesterone levels drop again after childbirth.   If fertilization does not occur, the corpus luteum dies, stopping the production of hormones. This sudden drop in progesterone causes the uterine lining to break down, accompanied by blood (menstruation).   This starts the cycle back at day 1. The whole process starts all over again. Woman go through this cycle every month from puberty to menopause. Women have breaks only for pregnancy and breastfeeding (lactation), unless the woman has health problems that affect the female hormone system or chooses to use oral contraceptives to have unnatural menstrual periods.  HOME CARE  INSTRUCTIONS   Keep track of your periods by using a calendar.   If you use tampons, get the least absorbent to avoid toxic shock syndrome.   Do not leave tampons in the vagina over night or longer than 6 hours.   Wear a sanitary pad over night.   Exercise 3 to 5 times a week or more.   Avoid foods and drinks that you know will make your symptoms worse before or during your period.  SEEK MEDICAL CARE IF:   You develop a fever of 100 F (37.8 C) or higher with your period.   Your periods are lasting more than 7 days.   Your period is so heavy that you have to change pads or tampons every 30 minutes.   You develop clots with your period and never had clots before.   You cannot get relief from over-the-counter medication for your symptoms.   Your period has not started, and it has been longer than 35 days.  Document Released: 04/04/2002 Document Revised: 04/03/2011 Document Reviewed: 01/28/2008 [facility] Patient Information 2012 Eugenio Sa[place], [place].  Menstruacin (Menstruation) La menstruacin es la eliminacin mensual de sangre, tejidos, lquidos y mucus. Tambin se la conoce como perodo. En este perodo la Agricultural consultant. El [place], o cantidad de [place], [place]  dura entre 3 y [address] cada [address]. El ciclo menstrual est controlado por las hormonas.Las hormonas son sustancias qumicas que produce el organismo para regular sus diferentes funciones. El primer perodo menstrual puede comenzar en cualquier [employer] 8 y los 16 aos. Sin embargo, generalmente [employer] 11 y los [address]. Algunas nias tienen un ciclo menstrual mensual desde el comienzo. Pero, en los comienzos de la [place], no es infrecuente perder slo algunas gotas de sangre o Pension scheme manager la ropa interior. Tampoco es infrecuente [employer] perodos en un mes o que pase un mes o dos sin tenerlo en los primeros meses. SINTOMAS  Clicos abdominales leves a moderados.   Dolor en la zona baja de la  espalda.  Los sntomas pueden aparecer [employer] 5 y [address] previos al comienzo del perodo menstrual, y se lo denomina sndrome premenstrual.  Dolor de [place].   Dolor e hinchazn en las mamas.   Hinchazn.   Somnolencia (fatiga.   Cambios en el estado de nimo.   Deseo intenso de consumir ciertos alimentos.  Estos son signos y sntomas normales y Orthoptist. Para ayudar a [employer], consulte con su mdico si puede tomar medicamentos de venta libre para el dolor o las [place]. Si no puede controlar los sntomas, consulte con su mdico.  LAS HORMONAS QUE INTERVIENEN EN EL CICLO MENSTRUAL La menstruacin se produce debido a las hormonas que segrega la glndula pituitaria en el cerebro y a los ovarios que afectan la superficie del tero. Primero, la glndula pituitaria produce en el cerebro la hormona estimulante del folculo.  Esta hormona estimula la produccin de [employer] ovarios, lo que engruesa la superficie del tero y comienza a Environmental education officer un vulo en el ovario. Aproximadamente 14 das despus, la glndula pituitaria produce otra hormona denominada hormona luteinizante. La hormona luteinizante hace que el vulo salga del saco en el que se encuentra dentro del ovario. El saco vaco del ovario, denominado cuerpo lteo es estimulado por otra hormona proveniente de la glndula pituitaria que se denomina luteotrofina. El cuerpo lteo comienza a producir estrgenos y Education officer, museum. La progesterona prepara la superficie del tero para que el vulo fertilizado (vulo y espermatozoide) se adhiera a esta superficie del tero y comience a desarrollarse para formar el feto. Si el vulo no es fertilizado, el cuerpo lteo deja de producir estrgenos y Education officer, museum y desaparece, la superficie del tero se desprende y comienza el perodo menstrual. Entonces el ciclo menstrual se inicia una y [place] vez y Fish farm manager, excepto que ocurra un Psychiatrist o comience  la menopausia. La secrecin de hormonas es un proceso complejo. Varias partes del organismo estn involucradas en muchas actividades qumicas. Las hormonas sexuales femeninas tambin cumplen otras funciones en el organismo de la [place]. Los estrgenos [employer] impulso sexual de Architectural technologist (libido). Es un diurtico natural (ayuda al organismo a [employer] lquidos). Tambin interviene en el proceso de formacin los [place]. Por lo tanto, Pharmacologist la salud hormonal es fundamental para todos los niveles del bienestar de la [place]. Generalmente estas hormonas estn presentes en cantidades normales y producen el ciclo menstrual. Lo ms importante es la relacin entre estos (pequeos) niveles de hormonas. Cuando el equilibrio se [place], se producen irregularidades menstruales. CMO SE PRODUCE EL CICLO MENSTRUAL?  Los ciclos menstruales varan entre 21 y [address] siendo el promedio de [address]. El ciclo comienza Film/video editor en que se produce el sangrado. En este momento, la  glndula pituitaria en el cerebro libera FSH, que viaja a travs del torrente [employer]. La FSH estimula los folculos en los ovarios. Prepara al organismo para la ovulacin, la que se produce alrededor del da 14 del ciclo. Luego los ovarios liberan estrgenos y esto asegura que las condiciones en el tero sean las adecuadas para la implantacin del vulo fertilizado.   Cuando los niveles de estrgenos alcanzan un nivel lo suficientemente elevado, envan una seal a una glndula que se encuentra en el cerebro (glndula pituitaria) para liberar cierta cantidad de LH. Esto provoca la liberacin del vulo maduro del folculo (ovulacin). Generalmente slo un folculo libera un huevo, pero en algunos casos ms de un folculo liberan huevos, especialmente cuando se estimulan los ovarios por fertilizacin in vitro. Luego el vulo se instala en la trompa de [place] ms cercana y Artist. El folculo que ha estallado  dentro del ovario, y que [place], ahora se denomina cuerpo lteo ( o "cuerpo amarillo") El cuerpo lteo sigue liberando (segregando) cantidades reducidas de estrgeno. Esto hace que se cierre y se endurezca el crvix. Esto seca el mucus llevndolo al estado natural de infertilidad.   El cuerpo lteo tambin comienza a Museum/gallery conservator grandes cantidades de progesterona. Esto hace que la cobertura interna del tero (el endometrio) se espese an ms, preparndose totalmente para recibir al vulo El vulo comienza su trayecto [place], desde las trompas de [employer]. Y le enva a los ovarios la seal para que no liberen ms vulos. Interviene en el regreso del mucus cervical a su estado de infertilidad.   Si el vulo se implanta exitosamente en el tejido que recubre internamente el tero y se produce el [place], los niveles de progesterona continuarn [place]. Generalmente, esta es la hormona que favorece en algunas mujeres embarazadas la sensacin de [place], como una "euforia natural". Los niveles de progesterona vuelven a Software engineer despus del parto   Si la fertilizacin no se produce, el cuerpo [employer], y cesa la produccin de hormonas. Esta cada sbita de los niveles de progesterona hace que la superficie interna uterina se rompa, lo que produce una hemorragia (menstruacin).   Y as vuelve a Oncologist 1. Todo el proceso se repite nuevamente. Las mujeres atraviesan este ciclo todos los meses, desde la pubertad a la menopausia. El ciclo se interrumpe slo en el caso de embarazo y Tour manager, excepto que la mujer sufra problemas de salud que afecten su sistema hormonal o elija utilizar anticonceptivos orales y tenga perodos menstruales no naturales.  INSTRUCCIONES PARA EL CUIDADO DOMICILIARIO  Mantenga un registro de sus perodos utilizando un calendario.   Si utiliza tampones, use los menos absorbentes para evitar el sndrome de shock txico.   No deje  el tampn en la vagina durante la noche o ms de 6 horas.   Utilice una toallita higinica para dormir.   Realice actividad fsica 3 a 5 veces por semana, o ms.   Evite los alimentos y bebidas que sabe empeorarn sus sntomas antes o durante el perodo.  SOLICITE ANTENCIN MDICA SI:  Su temperatura se eleva por encima de 100 F (37.8 C).   Sus perodos duran ms de [address]   Son tan abundantes que debe cambiarse el apsito o el tampn cada 30 minutos.   Observa que elimina cogulos y nunca los haba tenido antes.   No obtiene alivio de sus sntomas con los medicamentos de [address].  Su perodo no se ha iniciado y han pasado ms de [address]  Document Released: 01/22/2005 Document Revised: 04/03/2011 [facility] Patient Information 2012 [place], [place].

## 2011-09-19 LAB — GC/CHLAMYDIA PROBE AMP, GENITAL
Chlamydia, DNA Probe: NEGATIVE
GC Probe Amp, Genital: NEGATIVE

## 2011-10-24 ENCOUNTER — Emergency Department (HOSPITAL_COMMUNITY): Payer: Self-pay

## 2011-10-24 ENCOUNTER — Emergency Department (HOSPITAL_COMMUNITY)
Admission: EM | Admit: 2011-10-24 | Discharge: 2011-10-24 | Disposition: A | Discharge status: Home / Self Care | Payer: Self-pay | Attending: Emergency Medicine | Admitting: Emergency Medicine

## 2011-10-24 ENCOUNTER — Encounter (HOSPITAL_COMMUNITY): Payer: Self-pay | Admitting: Emergency Medicine

## 2011-10-24 DIAGNOSIS — D72829 Elevated white blood cell count, unspecified: Secondary | ICD-10-CM | POA: Insufficient documentation

## 2011-10-24 DIAGNOSIS — N949 Unspecified condition associated with female genital organs and menstrual cycle: Secondary | ICD-10-CM | POA: Insufficient documentation

## 2011-10-24 DIAGNOSIS — J45909 Unspecified asthma, uncomplicated: Secondary | ICD-10-CM | POA: Insufficient documentation

## 2011-10-24 DIAGNOSIS — B9689 Other specified bacterial agents as the cause of diseases classified elsewhere: Secondary | ICD-10-CM

## 2011-10-24 DIAGNOSIS — R102 Pelvic and perineal pain: Secondary | ICD-10-CM

## 2011-10-24 LAB — CBC WITH DIFFERENTIAL/PLATELET
HCT: 35.9 % — ABNORMAL LOW (ref 36.0–46.0)
Hemoglobin: 13.3 g/dL (ref 12.0–15.0)
Lymphocytes Relative: 28 % (ref 12–46)
Lymphs Abs: 3.6 10*3/uL (ref 0.7–4.0)
MCHC: 37 g/dL — ABNORMAL HIGH (ref 30.0–36.0)
Monocytes Absolute: 1 10*3/uL (ref 0.1–1.0)
Monocytes Relative: 8 % (ref 3–12)
Neutro Abs: 8 10*3/uL — ABNORMAL HIGH (ref 1.7–7.7)
WBC: 12.7 10*3/uL — ABNORMAL HIGH (ref 4.0–10.5)

## 2011-10-24 LAB — BASIC METABOLIC PANEL
BUN: 9 mg/dL (ref 6–23)
CO2: 21 mEq/L (ref 19–32)
Chloride: 106 mEq/L (ref 96–112)
Creatinine, Ser: 0.64 mg/dL (ref 0.50–1.10)
Glucose, Bld: 99 mg/dL (ref 70–99)

## 2011-10-24 LAB — URINALYSIS, ROUTINE W REFLEX MICROSCOPIC
Bilirubin Urine: NEGATIVE
Nitrite: NEGATIVE
Specific Gravity, Urine: 1.036 — ABNORMAL HIGH (ref 1.005–1.030)
Urobilinogen, UA: 0.2 mg/dL (ref 0.0–1.0)

## 2011-10-24 LAB — WET PREP, GENITAL
Trich, Wet Prep: NONE SEEN
Yeast Wet Prep HPF POC: NONE SEEN

## 2011-10-24 LAB — URINE MICROSCOPIC-ADD ON

## 2011-10-24 MED ORDER — HYDROCODONE-ACETAMINOPHEN 5-325 MG PO TABS: ORAL_TABLET | ORAL | Status: AC

## 2011-10-24 MED ORDER — LIDOCAINE HCL (PF) 1 % IJ SOLN
5.0000 mL | Freq: Once | INTRAMUSCULAR | Status: AC
  Administered 2011-10-24: 2 mL

## 2011-10-24 MED ORDER — METRONIDAZOLE 500 MG PO TABS: 500.0000 mg | ORAL_TABLET | Freq: Two times a day (BID) | ORAL | Status: AC

## 2011-10-24 MED ORDER — AZITHROMYCIN 250 MG PO TABS: 250.0000 mg | ORAL_TABLET | Freq: Every day | ORAL | Status: AC

## 2011-10-24 MED ORDER — AZITHROMYCIN 1 G PO PACK
1.0000 g | PACK | Freq: Once | ORAL | Status: AC
  Administered 2011-10-24: 1 g via ORAL
  Filled 2011-10-24: qty 1

## 2011-10-24 MED ORDER — CEFTRIAXONE SODIUM 250 MG IJ SOLR
250.0000 mg | Freq: Once | INTRAMUSCULAR | Status: AC
  Administered 2011-10-24: 250 mg via INTRAMUSCULAR
  Filled 2011-10-24: qty 250

## 2011-10-24 MED ORDER — LIDOCAINE HCL (PF) 1 % IJ SOLN
INTRAMUSCULAR | Status: AC
  Administered 2011-10-24: 2 mL
  Filled 2011-10-24: qty 5

## 2011-10-24 NOTE — ED Notes (Signed)
C/o pelvic pain, lower back pain, vaginal bumps, and vaginal bleeding x 15 days.  Also reports nausea and vomiting since getting Depo shot on May 7th.

## 2011-10-24 NOTE — ED Notes (Signed)
PT TRANSPORTED TO ULTRASOUND

## 2011-10-24 NOTE — ED Notes (Signed)
PT WAITING  FOR ULTRASOUND.

## 2011-10-24 NOTE — ED Provider Notes (Signed)
Medical screening examination/treatment/procedure(s) were performed by non-physician practitioner and as supervising physician I was immediately available for consultation/collaboration.   Elia Keenum, MD 10/24/11 0737 

## 2011-10-24 NOTE — ED Provider Notes (Signed)
Medical screening examination/treatment/procedure(s) were performed by non-physician practitioner and as supervising physician I was immediately available for consultation/collaboration.  Sunnie Nielsen, MD 10/24/11 2300

## 2011-10-24 NOTE — Discharge Instructions (Signed)
Please read and follow all provided instructions.  Your diagnoses today include:  1. Pelvic pain   2. Bacterial vaginosis     Tests performed today include:  Blood counts and electrolytes  Urine test to look for infection  Vaginal swab - shows bacterial vaginosis  Pelvic ultrasound - was normal  Vital signs. See below for your results today.   Medications prescribed:   Vicodin (hydrocodone/acetaminophen) - narcotic pain medication  You have been prescribed narcotic pain medication such as Vicodin or Percocet: DO NOT drive or perform any activities that require you to be awake and alert because this medicine can make you drowsy. BE VERY CAREFUL not to take multiple medicines containing Tylenol (also called acetaminophen). Doing so can lead to an overdose which can damage your liver and cause liver failure and possibly death.    Azithromycin - antibiotic  Flagyl - antibiotic  Take any prescribed medications only as directed.  Home care instructions:   Follow any educational materials contained in this packet.  Follow-up instructions: Please follow-up with your primary care provider in the next 2 days for further evaluation of your symptoms. If you do not have a primary care doctor -- see below for referral information.   You may also see the gynecologist referral provided.   Return instructions:  SEEK IMMEDIATE MEDICAL ATTENTION IF:  The pain does not go away or becomes severe   A temperature above 101F develops   Repeated vomiting occurs (multiple episodes)   The pain becomes localized to portions of the abdomen. The right side could possibly be appendicitis. In an adult, the left lower portion of the abdomen could be colitis or diverticulitis.   Blood is being passed in stools or vomit (bright red or black tarry stools)   You develop chest pain, difficulty breathing, dizziness or fainting, or become confused, poorly responsive, or inconsolable (young  children)  If you have any other emergent concerns regarding your health  Additional Information: Abdominal (belly) pain can be caused by many things. Your caregiver performed an examination and possibly ordered blood/urine tests and imaging (CT scan, x-rays, ultrasound). Many cases can be observed and treated at home after initial evaluation in the emergency department. Even though you are being discharged home, abdominal pain can be unpredictable. Therefore, you need a repeated exam if your pain does not resolve, returns, or worsens. Most patients with abdominal pain don't have to be admitted to the hospital or have surgery, but serious problems like appendicitis and gallbladder attacks can start out as nonspecific pain. Many abdominal conditions cannot be diagnosed in one visit, so follow-up evaluations are very important.  Your vital signs today were: BP 96/48  Pulse 68  Temp 97.3 F (36.3 C) (Oral)  Resp 18  SpO2 98%  LMP 10/09/2011 If your blood pressure (bp) was elevated above 135/85 this visit, please have this repeated by your doctor within one month. -------------- No Primary Care Doctor Call [facility]  [phone] Other agencies that provide inexpensive medical care    [facility]  [phone]    [facility]  [phone]    [facility]  [phone]    [facility]  [phone]    [facility]  [phone]    [facility]  [phone] -------------- RESOURCE GUIDE:  Dental Problems  Patients with Medicaid: [facility] [phone]  [address]                                            [address] Phone:  [phone]                                                      Phone:  [phone]  If unable to pay or uninsured, contact:  [facility] or [facility]. to become qualified for the adult dental clinic.  Chronic Pain Problems Contact [facility]  [phone] Patients need to be referred by their primary care doctor.  Insufficient Money for Medicine Contact United Way:  call "211" or [facility] [phone].  Psychological Services [facility]  [phone] [facility]  [phone] [facility]   [phone] (emergency services [phone])  Substance Abuse Resources [facility] Drug Services  [phone] [facility] [phone] [facility] [place] [phone] Floydene Flock [phone] Residential & Outpatient Substance Abuse Program  [phone]  Abuse/Neglect [facility] Child Abuse Hotline [phone] [facility] Child Abuse Hotline [phone] (After Hours)  Emergency Shelter [facility] [phone]  Maternity Homes [facility] [phone] [employer] Services [phone]  [facility] Resources  [facility]     United [employer]                          [facility] Dept. [address] [place]                       [address]      [address]  Blondell Reveal Phone:  [phone]                                   Phone:  [phone]                 Phone:  [phone]  [facility] Phone:  [phone]  [facility] Child Abuse Hotline [phone] (After Hours)

## 2011-10-24 NOTE — ED Provider Notes (Signed)
History     CSN: [id number]  Arrival date & time 10/24/11  0008   First MD Initiated Contact with Patient 10/24/11 [phone]      Chief Complaint  Patient presents with  . Pelvic Pain    (Consider location/radiation/quality/duration/timing/severity/associated sxs/prior treatment) HPI  Past Medical History  Diagnosis Date  . Asthma   . Seasonal allergies   . Gall stones     Past Surgical History  Procedure Date  . Cholecystectomy     No family history on file.  History  Substance Use Topics  . Smoking status: Never Smoker   . Smokeless tobacco: Not on file  . Alcohol Use: No    OB History    Grav Para Term Preterm Abortions TAB SAB Ect Mult Living   2 2 1 1      2       Review of Systems  Allergies  Review of patient's allergies indicates no known allergies.  Home Medications   Current Outpatient Rx  Name Route Sig Dispense Refill  . ALBUTEROL SULFATE HFA 108 (90 BASE) MCG/ACT IN AERS Inhalation Inhale 2 puffs into the lungs every 6 (six) hours as needed. For shortness of breath    . ADULT MULTIVITAMIN W/MINERALS CH Oral Take 1 tablet by mouth daily.      BP 96/48  Pulse 68  Temp 97.3 F (36.3 C) (Oral)  Resp 18  SpO2 98%  LMP 10/09/2011  Physical Exam  ED Course  Procedures (including critical care time)  Labs Reviewed  URINALYSIS, ROUTINE W REFLEX MICROSCOPIC - Abnormal; Notable for the following:    Specific Gravity, Urine 1.036 (*)     Hgb urine dipstick MODERATE (*)     Leukocytes, UA TRACE (*)     All other components within normal limits  CBC WITH DIFFERENTIAL - Abnormal; Notable for the following:    WBC 12.7 (*)     HCT 35.9 (*)     MCHC 37.0 (*)     Neutro Abs 8.0 (*)     All other components within normal limits  BASIC METABOLIC PANEL - Abnormal; Notable for the following:    Potassium 3.3 (*)     All other components within normal limits  WET PREP, GENITAL - Abnormal; Notable for the following:    Clue Cells Wet Prep HPF POC  MODERATE (*)     WBC, Wet Prep HPF POC FEW (*)     All other components within normal limits  POCT PREGNANCY, URINE  URINE MICROSCOPIC-ADD ON  PREGNANCY, URINE  GC/CHLAMYDIA PROBE AMP, GENITAL   US Transvaginal Non-ob  10/24/2011  *RADIOLOGY REPORT*  Clinical Data: Pelvic pain  TRANSABDOMINAL AND TRANSVAGINAL ULTRASOUND OF PELVIS Technique:  Both transabdominal and transvaginal ultrasound examinations of the pelvis were performed. Transabdominal technique was performed for global imaging of the pelvis including uterus, ovaries, adnexal regions, and pelvic cul-de-sac.  It was necessary to proceed with endovaginal exam following the transabdominal exam to visualize the endometrium.  Comparison:  01/01/2010  Findings:  Uterus: 7.3 x 3.5 x 5.7 cm.  Normal appearing myometrium.  Endometrium: 2.5 mm.  No fluid or mass is present.  Right ovary:  2.5 x 1.6 x 1.7 cm.  Normal ovary  Left ovary: 2.2 x 1.6 x 1.2 cm.  Normal ovary.  Other findings: No free fluid  IMPRESSION: Normal study. No evidence of pelvic mass or other significant abnormality.  Original Report Authenticated By: Camelia Phenes, M.D.   US Pelvis Complete  10/24/2011  *RADIOLOGY REPORT*  Clinical Data: Pelvic pain  TRANSABDOMINAL AND TRANSVAGINAL ULTRASOUND OF PELVIS Technique:  Both transabdominal and transvaginal ultrasound examinations of the pelvis were performed. Transabdominal technique was performed for global imaging of the pelvis including uterus, ovaries, adnexal regions, and pelvic cul-de-sac.  It was necessary to proceed with endovaginal exam following the transabdominal exam to visualize the endometrium.  Comparison:  01/01/2010  Findings:  Uterus: 7.3 x 3.5 x 5.7 cm.  Normal appearing myometrium.  Endometrium: 2.5 mm.  No fluid or mass is present.  Right ovary:  2.5 x 1.6 x 1.7 cm.  Normal ovary  Left ovary: 2.2 x 1.6 x 1.2 cm.  Normal ovary.  Other findings: No free fluid  IMPRESSION: Normal study. No evidence of pelvic mass or other  significant abnormality.  Original Report Authenticated By: Camelia Phenes, M.D.     1. Pelvic pain   2. Bacterial vaginosis     6:46 AM Pelvic pain x months. Pending pelvic US. She'll get rocephin and zithromax here. D/c home on zithromax, flagyl, pain medicine.   Vital signs reviewed and are as follows: Filed Vitals:   10/24/11 0422  BP: 96/48  Pulse: 68  Temp: 97.3 F (36.3 C)  Resp: 18   8:06 AM [place] neg. Patient seen and examined. She appears well. Informed of results and treatment plan. Urged to follow-up with GYN, will give referral info. She agrees with plan.  Patient counseled on use of narcotic pain medications. Counseled not to combine these medications with others containing tylenol. Urged not to drink alcohol, drive, or perform any other activities that requires focus while taking these medications. The patient verbalizes understanding and agrees with the plan.  The patient was urged to return to the Emergency Department immediately with worsening of current symptoms, worsening abdominal pain, persistent vomiting, blood noted in stools, fever, or any other concerns. The patient verbalized understanding.    MDM  Pelvic pain, given exam, WBC count, clue cells, will treat as PID (azithro instead of doxy given expense of doxy). Pt will need GYN follow-up given pain is chronic and etiology is uncertain.         [place], [place] 10/24/11 [phone]

## 2011-10-24 NOTE — ED Provider Notes (Signed)
History     CSN: [id number]  Arrival date & time 10/24/11  0008   First MD Initiated Contact with Patient 10/24/11 [phone]      Chief Complaint  Patient presents with  . Pelvic Pain    (Consider location/radiation/quality/duration/timing/severity/associated sxs/prior treatment) HPI Comments: Patient here with lower abdominal, back and pelvic pain - states that this pain started about 15 days ago (though review of her chart reveals several visits [facility] and [facility] for same thing since early May) - states that she has taken depoprovera on May 7th (though note from May 15 states that she has not had it yet) and reports crampy abdominal pain, radiates into back, also with vaginal bleeding and bumps to the labia since then as well.  She states that she just wants to find out why she keeps hurting, states that she normally does not.  Patient is a 21 y.o. female presenting with pelvic pain. The history is provided by the patient. No language interpreter was used.  Pelvic Pain This is a chronic problem. The current episode started 1 to 4 weeks ago. The problem occurs constantly. The problem has been unchanged. Associated symptoms include abdominal pain. Pertinent negatives include no anorexia, arthralgias, change in bowel habit, chest pain, chills, congestion, coughing, diaphoresis, fatigue, fever, headaches, joint swelling, myalgias, nausea, neck pain, numbness, rash, sore throat, swollen glands, urinary symptoms, vertigo, visual change, vomiting or weakness. Nothing aggravates the symptoms. She has tried nothing for the symptoms. The treatment provided no relief.    Past Medical History  Diagnosis Date  . Asthma   . Seasonal allergies   . Gall stones     Past Surgical History  Procedure Date  . Cholecystectomy     No family history on file.  History  Substance Use Topics  . Smoking status: Never Smoker   . Smokeless tobacco: Not on file  . Alcohol Use: No    OB History    Grav Para Term Preterm Abortions TAB SAB Ect Mult Living   2 2 1 1      2       Review of Systems  Constitutional: Negative for fever, chills, diaphoresis and fatigue.  HENT: Negative for congestion, sore throat and neck pain.   Respiratory: Negative for cough.   Cardiovascular: Negative for chest pain.  Gastrointestinal: Positive for abdominal pain. Negative for nausea, vomiting, anorexia and change in bowel habit.  Genitourinary: Positive for pelvic pain.  Musculoskeletal: Negative for myalgias, joint swelling and arthralgias.  Skin: Negative for rash.  Neurological: Negative for vertigo, weakness, numbness and headaches.  All other systems reviewed and are negative.    Allergies  Review of patient's allergies indicates no known allergies.  Home Medications   Current Outpatient Rx  Name Route Sig Dispense Refill  . ALBUTEROL SULFATE HFA 108 (90 BASE) MCG/ACT IN AERS Inhalation Inhale 2 puffs into the lungs every 6 (six) hours as needed. For shortness of breath    . ADULT MULTIVITAMIN W/MINERALS CH Oral Take 1 tablet by mouth daily.      BP 96/48  Pulse 68  Temp 97.3 F (36.3 C) (Oral)  Resp 18  SpO2 98%  LMP 10/09/2011  Physical Exam  Nursing note and vitals reviewed. Constitutional: She is oriented to person, place, and time. She appears well-developed and well-nourished. No distress.  HENT:  Head: Normocephalic and atraumatic.  Right Ear: External ear normal.  Left Ear: External ear normal.  Nose: Nose normal.  Mouth/Throat: Oropharynx is clear  and moist. No oropharyngeal exudate.  Eyes: Conjunctivae are normal. Pupils are equal, round, and reactive to light. No scleral icterus.  Neck: Normal range of motion. Neck supple.  Cardiovascular: Normal rate, regular rhythm and normal heart sounds.  Exam reveals no gallop and no friction rub.   No murmur heard. Pulmonary/Chest: Effort normal and breath sounds normal. No respiratory distress. She has no wheezes. She has  no rales. She exhibits no tenderness.  Abdominal: Soft. Bowel sounds are normal. She exhibits no distension and no mass. There is tenderness in the suprapubic area. There is no rebound and no guarding.    Genitourinary: There is no rash, tenderness or lesion on the right labia. There is no rash, tenderness or lesion on the left labia. Right adnexum displays tenderness. Right adnexum displays no mass and no fullness. Left adnexum displays tenderness. Left adnexum displays no mass and no fullness. There is bleeding around the vagina. No vaginal discharge found.       Dark blood in vaginal vault  Musculoskeletal: Normal range of motion. She exhibits no edema and no tenderness.  Lymphadenopathy:    She has no cervical adenopathy.  Neurological: She is alert and oriented to person, place, and time. No cranial nerve deficit. She exhibits normal muscle tone. Coordination normal.  Skin: Skin is warm and dry. No rash noted. No erythema. No pallor.  Psychiatric: She has a normal mood and affect. Her behavior is normal. Judgment and thought content normal.    ED Course  Procedures (including critical care time)  Labs Reviewed  URINALYSIS, ROUTINE W REFLEX MICROSCOPIC - Abnormal; Notable for the following:    Specific Gravity, Urine 1.036 (*)     Hgb urine dipstick MODERATE (*)     Leukocytes, UA TRACE (*)     All other components within normal limits  CBC WITH DIFFERENTIAL - Abnormal; Notable for the following:    WBC 12.7 (*)     HCT 35.9 (*)     MCHC 37.0 (*)     Neutro Abs 8.0 (*)     All other components within normal limits  BASIC METABOLIC PANEL - Abnormal; Notable for the following:    Potassium 3.3 (*)     All other components within normal limits  POCT PREGNANCY, URINE  URINE MICROSCOPIC-ADD ON  PREGNANCY, URINE  GC/CHLAMYDIA PROBE AMP, GENITAL  WET PREP, GENITAL   No results found.   No diagnosis found.    MDM  Patient here with likely chronic pelvic and lower abdominal  pain - plan to get [place] of pelvic due to leukocytosis and the CMT - placed the patient on zithromax and rocephin and she will be followed by Felicita Gage, PA-C for discharge - I do not suspect any surgical conditions like TOA, I also do not suspect PID as there was no cervicitis - will treat anyway.        Izola Price [place], [place] 10/24/11 [phone]

## 2011-11-13 ENCOUNTER — Emergency Department (HOSPITAL_COMMUNITY)
Admission: EM | Admit: 2011-11-13 | Discharge: 2011-11-13 | Disposition: A | Discharge status: Home / Self Care | Payer: Self-pay | Attending: Emergency Medicine | Admitting: Emergency Medicine

## 2011-11-13 DIAGNOSIS — Z9089 Acquired absence of other organs: Secondary | ICD-10-CM | POA: Insufficient documentation

## 2011-11-13 DIAGNOSIS — N949 Unspecified condition associated with female genital organs and menstrual cycle: Secondary | ICD-10-CM | POA: Insufficient documentation

## 2011-11-13 DIAGNOSIS — Z9109 Other allergy status, other than to drugs and biological substances: Secondary | ICD-10-CM | POA: Insufficient documentation

## 2011-11-13 DIAGNOSIS — J45909 Unspecified asthma, uncomplicated: Secondary | ICD-10-CM | POA: Insufficient documentation

## 2011-11-13 DIAGNOSIS — G8929 Other chronic pain: Secondary | ICD-10-CM

## 2011-11-13 LAB — CBC WITH DIFFERENTIAL/PLATELET
Basophils Relative: 0 % (ref 0–1)
Eosinophils Absolute: 0.2 10*3/uL (ref 0.0–0.7)
Eosinophils Relative: 2 % (ref 0–5)
Hemoglobin: 14.3 g/dL (ref 12.0–15.0)
MCH: 31.9 pg (ref 26.0–34.0)
MCHC: 36.2 g/dL — ABNORMAL HIGH (ref 30.0–36.0)
MCV: 88.2 fL (ref 78.0–100.0)
Monocytes Relative: 9 % (ref 3–12)
Neutrophils Relative %: 60 % (ref 43–77)

## 2011-11-13 LAB — URINALYSIS, ROUTINE W REFLEX MICROSCOPIC
Bilirubin Urine: NEGATIVE
Glucose, UA: NEGATIVE mg/dL
Ketones, ur: NEGATIVE mg/dL
Leukocytes, UA: NEGATIVE
Nitrite: NEGATIVE
Protein, ur: NEGATIVE mg/dL

## 2011-11-13 MED ORDER — IBUPROFEN 600 MG PO TABS: 600.0000 mg | ORAL_TABLET | Freq: Four times a day (QID) | ORAL | Status: DC | PRN

## 2011-11-13 MED ORDER — OXYCODONE-ACETAMINOPHEN 5-325 MG PO TABS: 2.0000 | ORAL_TABLET | ORAL | Status: DC | PRN

## 2011-11-13 NOTE — ED Notes (Signed)
Pelvic pain. Feeling dizzy and h/a. Emesis this am.

## 2011-11-13 NOTE — ED Notes (Signed)
Pt denies any pain or questions upon discharge. 

## 2011-11-13 NOTE — ED Provider Notes (Signed)
History     CSN: [id number]  Arrival date & time 11/13/11  1727   First MD Initiated Contact with Patient 11/13/11 2126      Chief Complaint  Patient presents with  . Abdominal Pain    HPI Patient has had 6-12 months of chronic pelvic pain.  Patient noticed pain today said she vomited once.  Patient was worked up 2 weeks ago with cultures all which were negative.  She also had an ultrasound which was negative.  She has appointment to see a gynecologist.  Patient denies fever.  Has had previous history of cholecystectomy. Past Medical History  Diagnosis Date  . Asthma   . Seasonal allergies   . Gall stones     Past Surgical History  Procedure Date  . Cholecystectomy     No family history on file.  History  Substance Use Topics  . Smoking status: Never Smoker   . Smokeless tobacco: Not on file  . Alcohol Use: No    OB History    Grav Para Term Preterm Abortions TAB SAB Ect Mult Living   2 2 1 1      2       Review of Systems  All other systems reviewed and are negative.    Allergies  Review of patient's allergies indicates no known allergies.  Home Medications   Current Outpatient Rx  Name Route Sig Dispense Refill  . ALBUTEROL SULFATE HFA 108 (90 BASE) MCG/ACT IN AERS Inhalation Inhale 2 puffs into the lungs every 6 (six) hours as needed. For shortness of breath    . IBUPROFEN 600 MG PO TABS Oral Take 1 tablet (600 mg total) by mouth every 6 (six) hours as needed for pain. 30 tablet 0  . OXYCODONE-ACETAMINOPHEN 5-325 MG PO TABS Oral Take 2 tablets by mouth every 4 (four) hours as needed for pain. 20 tablet 0    BP 104/66  Pulse 83  Temp 98.1 F (36.7 C) (Oral)  Resp 18  SpO2 98%  LMP 10/09/2011  Physical Exam  Nursing note and vitals reviewed. Constitutional: She is oriented to person, place, and time. She appears well-developed and well-nourished. No distress.  HENT:  Head: Normocephalic and atraumatic.  Eyes: Pupils are equal, round, and  reactive to light.  Neck: Normal range of motion.  Cardiovascular: Normal rate and intact distal pulses.   Pulmonary/Chest: No respiratory distress.  Abdominal: Normal appearance. She exhibits no distension and no mass. There is generalized tenderness. There is no rigidity, no rebound and no guarding.  Musculoskeletal: Normal range of motion.  Neurological: She is alert and oriented to person, place, and time. No cranial nerve deficit.  Skin: Skin is warm and dry. No rash noted.  Psychiatric: She has a normal mood and affect. Her behavior is normal.    ED Course  Procedures (including critical care time)  Labs Reviewed  URINALYSIS, ROUTINE W REFLEX MICROSCOPIC - Abnormal; Notable for the following:    APPearance CLOUDY (*)     All other components within normal limits  CBC WITH DIFFERENTIAL - Abnormal; Notable for the following:    MCHC 36.2 (*)     All other components within normal limits  POCT PREGNANCY, URINE   No results found.   1. Chronic pelvic pain in female       MDM  In light of recent evaluation in cultures no further diagnostic work was indicated tonight.  Nothing has really changed the nature of her pain.  She does  have followup appointment which she should keep.  Endometriosis is a possibility.        Nelia Shi, MD 11/13/11 [phone]

## 2011-11-22 ENCOUNTER — Emergency Department (HOSPITAL_COMMUNITY): Payer: No Typology Code available for payment source

## 2011-11-22 ENCOUNTER — Encounter (HOSPITAL_COMMUNITY): Payer: Self-pay | Admitting: Family Medicine

## 2011-11-22 ENCOUNTER — Emergency Department (HOSPITAL_COMMUNITY)
Admission: EM | Admit: 2011-11-22 | Discharge: 2011-11-22 | Disposition: A | Discharge status: Home / Self Care | Payer: No Typology Code available for payment source | Attending: Emergency Medicine | Admitting: Emergency Medicine

## 2011-11-22 DIAGNOSIS — R51 Headache: Secondary | ICD-10-CM | POA: Insufficient documentation

## 2011-11-22 DIAGNOSIS — Y92410 Unspecified street and highway as the place of occurrence of the external cause: Secondary | ICD-10-CM | POA: Insufficient documentation

## 2011-11-22 DIAGNOSIS — S0181XA Laceration without foreign body of other part of head, initial encounter: Secondary | ICD-10-CM

## 2011-11-22 DIAGNOSIS — Z9109 Other allergy status, other than to drugs and biological substances: Secondary | ICD-10-CM | POA: Insufficient documentation

## 2011-11-22 DIAGNOSIS — M25569 Pain in unspecified knee: Secondary | ICD-10-CM | POA: Insufficient documentation

## 2011-11-22 DIAGNOSIS — Z9089 Acquired absence of other organs: Secondary | ICD-10-CM | POA: Insufficient documentation

## 2011-11-22 DIAGNOSIS — R109 Unspecified abdominal pain: Secondary | ICD-10-CM | POA: Insufficient documentation

## 2011-11-22 DIAGNOSIS — J45909 Unspecified asthma, uncomplicated: Secondary | ICD-10-CM | POA: Insufficient documentation

## 2011-11-22 DIAGNOSIS — Z23 Encounter for immunization: Secondary | ICD-10-CM | POA: Insufficient documentation

## 2011-11-22 DIAGNOSIS — R079 Chest pain, unspecified: Secondary | ICD-10-CM | POA: Insufficient documentation

## 2011-11-22 DIAGNOSIS — S0180XA Unspecified open wound of other part of head, initial encounter: Secondary | ICD-10-CM | POA: Insufficient documentation

## 2011-11-22 DIAGNOSIS — M25579 Pain in unspecified ankle and joints of unspecified foot: Secondary | ICD-10-CM | POA: Insufficient documentation

## 2011-11-22 LAB — COMPREHENSIVE METABOLIC PANEL
Alkaline Phosphatase: 73 U/L (ref 39–117)
BUN: 9 mg/dL (ref 6–23)
CO2: 25 mEq/L (ref 19–32)
GFR calc Af Amer: >90 mL/min (ref >90)
GFR calc non Af Amer: >90 mL/min (ref >90)
Glucose, Bld: 111 mg/dL — ABNORMAL HIGH (ref 70–99)
Potassium: 3 mEq/L — ABNORMAL LOW (ref 3.5–5.1)
Total Protein: 7.3 g/dL (ref 6.0–8.3)

## 2011-11-22 LAB — CBC
HCT: 39.9 % (ref 36.0–46.0)
Platelets: 238 10*3/uL (ref 150–400)
RDW: 11.9 % (ref 11.5–15.5)
WBC: 8.8 10*3/uL (ref 4.0–10.5)

## 2011-11-22 LAB — URINALYSIS, MICROSCOPIC ONLY
Hgb urine dipstick: NEGATIVE
Leukocytes, UA: NEGATIVE
Nitrite: NEGATIVE
Protein, ur: NEGATIVE mg/dL
Specific Gravity, Urine: 1.015 (ref 1.005–1.030)
Urobilinogen, UA: 1 mg/dL (ref 0.0–1.0)

## 2011-11-22 MED ORDER — ONDANSETRON HCL 4 MG/2ML IJ SOLN
INTRAMUSCULAR | Status: AC
  Administered 2011-11-22: 4 mg
  Filled 2011-11-22: qty 2

## 2011-11-22 MED ORDER — IOHEXOL 300 MG/ML  SOLN: 100.0000 mL | Freq: Once | INTRAMUSCULAR | Status: DC | PRN

## 2011-11-22 MED ORDER — HYDROMORPHONE HCL PF 1 MG/ML IJ SOLN
INTRAMUSCULAR | Status: AC
  Filled 2011-11-22: qty 1

## 2011-11-22 MED ORDER — TETANUS-DIPHTH-ACELL PERTUSSIS 5-2.5-18.5 LF-MCG/0.5 IM SUSP
0.5000 mL | Freq: Once | INTRAMUSCULAR | Status: AC
  Administered 2011-11-22: 0.5 mL via INTRAMUSCULAR
  Filled 2011-11-22: qty 0.5

## 2011-11-22 MED ORDER — ONDANSETRON HCL 4 MG/2ML IJ SOLN
4.0000 mg | Freq: Once | INTRAMUSCULAR | Status: AC
  Administered 2011-11-22: 4 mg via INTRAVENOUS
  Filled 2011-11-22: qty 2

## 2011-11-22 MED ORDER — HYDROMORPHONE HCL PF 1 MG/ML IJ SOLN
1.0000 mg | Freq: Once | INTRAMUSCULAR | Status: AC
  Administered 2011-11-22: 1 mg via INTRAVENOUS
  Filled 2011-11-22: qty 1

## 2011-11-22 NOTE — ED Notes (Signed)
Xray being done at this time.  Family at bedside.  Patient reports she had a negative preg test last week.  She receives the depo shot for birth control.  Patient reports she is having some diff breathing.  Oxygen sat is 99 percent on room air. vss
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
## 2011-11-22 NOTE — ED Notes (Signed)
Patient is resting.  Family at bedside.  She is requesting more medications.  ermd aware,  Dilaudid ordered
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
## 2011-11-22 NOTE — Progress Notes (Signed)
Chaplain responded to a trauma page. Chaplain visited briefly with patient and family. Patient stated that she feels nauseous and requested some medicine. Chaplain notified patient's nurse tech. Follow up as needed.

## 2011-11-22 NOTE — ED Provider Notes (Signed)
I saw and evaluated the patient, reviewed the resident's note and I agree with the findings and plan.  Cheri Guppy, MD 11/22/11 2146
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
## 2011-11-22 NOTE — Discharge Instructions (Signed)
RESOURCE GUIDE  Chronic Pain Problems: Contact [facility]  [phone] Patients need to be referred by their primary care doctor.  Insufficient Money for Medicine: Contact United Way:  call "211" or [facility] [phone].  No Primary Care Doctor: - Call Health Connect  [phone] - can help you locate a primary care doctor that  accepts your insurance, provides certain services, etc. - Physician Referral Service- [phone]  Agencies that provide inexpensive medical care: - [facility]  [phone] - [facility]  [phone] - [facility]  [phone] - Women's Clinic  [phone] - Planned Parenthood  [phone] Haynes Bast[facility]  [phone]  Medicaid-accepting [facility] Providers: - [facility]- [address], [address]  [phone], Mon-Fri 9am-7pm, Sat 9am-1pm - [facility]- [address], [address] [place]  [phone] - [facility]- [address], [address] [place]  [phone] [facility]- [facility]- [address] [place] [address]  [phone] - Renaye RakersVeita Bland- [address], [address], [phone]  Only accepts [place] patients after they have their name  applied to their card  Self Pay (no insurance) in [place]: - Sickle Cell Patients: Dr Willey BladeEric Dean, [facility]  [address], [phone] - [facility]- [address]  [phone]       Redge Gainer-     [facility] [place]- [address], [address]       -     [facility]- see information above (Speak to CitigroupPam H if you do not have insurance)       -  [facility]- [address], [phone]       -  [facility] [place]- [address],  [phone]       -  [facility]- [address] [place] [address], [phone]       -  Dr Julio Sickssei-Bonsu-  [address], [address], [place], [phone]       -  [facility]- [address], [phone]       -  [facility] - [address] [place] [address], [phone], also [address], [phone]       -    [facility]- [address], [phone], 1st & 3rd Saturday   every month, 10am-1pm  1) Find a Doctor and Pay Out of Pocket Although you won't have to find out who is covered by your insurance plan, it is a good idea to ask around and get recommendations. You will then need to call the office and see if the doctor you have chosen will accept you as a new patient and what types of options they offer for patients who are self-pay. Some doctors offer discounts or will set up payment plans for their patients who do not have insurance, but you will need to ask so you aren't surprised when you get to your appointment.  2) Contact Your Local Health Department Not all health departments have doctors that can see patients for sick visits, but many do, so it is worth a call to see if yours does. If you don't know where your local health department is, you can check in your phone book. The CDC also has a tool to help you locate your state's health department, and many state websites also have  listings of all of their local health departments.  3) Find a Walk-in Clinic If your illness is not likely to be very severe or complicated, you may want to try a walk in clinic. These are popping up all over the country in pharmacies, drugstores, and shopping centers. They're usually staffed by nurse practitioners or physician assistants that have been trained to treat common illnesses and complaints. They're usually fairly quick and inexpensive. However, if you have serious medical issues or chronic medical problems, these are probably not your best option  STD Testing - [facility] Department of [facility] Luis Llorons Torres, STD Clinic, [address] [place], phone [phone] or [phone].  Monday - Friday, call for an appointment. [facility]  Department of [employer], STD Clinic, [address], [place], phone [phone] or [phone].  Monday - Friday, call for an appointment.  Abuse/Neglect: [facility] Child Abuse Hotline [phone]) 293-8840 [facility] Child Abuse Hotline [phone]) 337-1068 (After Hours)  Emergency Shelter:  [facility] [phone]  Maternity Homes: - Room at the [place] [facility] [phone] - [facility] [phone]  MRSA Hotline #:   (508)367-7619  [facility] Resources  [facility]  United [facility] Dept. [address]                 [address]         [address] [place] [address]  Blondell Reveal Phone:  [phone]                                  Phone:  [phone]                   Phone:  [phone]  [facility], [phone] - [phone]       -     [facility] in [place], [address],                                  [phone], [facility] Child Abuse Hotline (812)289-2172 or [phone] (After Hours)   Behavioral Health Services  Substance Abuse Resources: - [facility] Drug Services  [phone] - [facility] [phone] - [facility] [place] [phone] Floydene Flock [phone] - Residential & Outpatient Substance Abuse Program  [phone]  Psychological Services: [facility]  [phone] Services  [phone] - [facility], [phone] [place]. [address], [place], ACCESS LINE: 9311756726 or [phone], EntrepreneurLoan.[email or web address]  Dental Assistance  If unable to pay or uninsured, contact:  [facility] or [facility]. to become qualified for the adult dental  clinic.  Patients with Medicaid: [facility] [phone] [address], [phone] [address] [phone]  If unable  to pay, or uninsured, contact [facility] [phone]) or [facility] Department [phone] in [place], [phone] in [facility]) to become qualified for the adult dental clinic  Other Low-Cost Community Dental Services: - [facility]- [address] [place], [place], [place], [address], [phone], Ext. [phone], 2nd and 4th Thursday of the month at 6:30am.  10 clients each day by appointment, can sometimes see walk-in patients if someone does not show for an appointment. [facility]- [address] [place] [place], [place], [address], [phone] - [facility]- [address], [place], [place], [address], [phone] [facility] Health Department- [phone] [facility] Health Department- [phone] [facility] Department- [phone]      Cuidados de Neomia Dear laceracin - Adultos  (Laceration Care, Adult)  Una herida cortante es un corte o lesin que atraviesa todas las capas de la piel y el tejido que se encuentra debajo de la piel.  TRATAMIENTO  Algunas laceraciones no requieren sutura. Algunas no deben cerrarse debido a que puede aumentar el riesgo de infeccin. Es importante que consulte al mdico lo antes posible despus de recibir una lesin para minimizar el riesgo de infeccin y aumentar la posibilidad de que se cierre con xito.  Cuando se cierra adecuadamente, podrn indicarle analgsicos, si los necesita. La herida debe limpiarse para combatir la infeccin. El mdico usar puntos (suturas), [place], o tiras [place] para Environmental consultant. Estos elementos mantendrn unidos los bordes de la piel para que se cure ms rpidamente y para un mejor resultado cosmtico. Sin embargo, todas las heridas se curarn con una cicatriz. Una vez que la herida se haya curado, las cicatrices pueden minimizarse  cubriendo la herida con pantalla solar durante el da por un lapso se 1 ao.  INSTRUCCIONES PARA EL CUIDADO EN EL HOGAR  Si tiene puntos o grapas:   Mantenga la herida limpia y [place].   Si tiene un (vendaje) cmbielo al menos una vez al da. Cmbielo si se moja o se ensucia, o segn las indicaciones del mdico.   Lave el corte dos veces por da con agua y [place]. Enjuguelo con agua para quitar todo el [place]. Seque dando palmaditas con una toalla limpia y seca.   Despus de limpiar, aplique una delgada capa de una crema con antibitico segn las indicaciones del mdico. Esto le ayudar a prevenir las infecciones y a Automotive engineer que el vendaje se Building services engineer.   Puede ducharse despus de las primeras 24 horas. No remoje la herida en agua hasta que le hayan quitado los puntos.   Solo tome medicamentos que se pueden comprar sin receta o recetados para Chief Technology Officer, Dentist o fiebre, como le indica el mdico.   Concurra para que le retiren los puntos o las grapas cuando el mdico le indique.  En caso que tenga tiras WFUXNATFT:   Mantenga la herida limpia y seca.   No deje que las tiras se mojen. Puede darse un bao cuidando de [employer] herida seca.   Si se moja, squela dando palmaditas con una toalla limpia.   Las tiras caern por s mismas. Puede recortar las tiras a medida que la herida se [place]. No quite las tiras que estn pegadas a la herida. Ellas se caern cuando sea el momento.  En caso que le hayan [place].   Podr mojara momentneamente la herida en la ducha o el bao. No frote ni sumerja la herida. No practique natacin. Evite transpirar con abundancia hasta que el [place] se  haya cado. Despus de ducharse o darse un bao, seque el corte dando palmaditas con una toalla limpia.   No aplique medicamentos lquidos, en crema o ungentos mientras el QUALCOMM est en su lugar. Podr aflojarlo antes de que la herida se cure.   Si tiene un vendaje, tenga cuidado de no aplicar cinta  adhesiva directamente [employer]. Esto puede hacer que el [place] se caiga antes de que la herida se haya curado.   Evite la exposicin prolongada a la luz del sol o a la International aid/development worker en QUALCOMM se Arts administrator. La exposicin a los Hydrographic surveyor la Training and development officer.   El [employer] la piel durante 5 a 10 das y Therapist, occupational. No quite la pelcula de [place].  Deber aplicarse la vacuna contra el ttanos si:  No recuerda cundo se coloc la vacuna la ltima vez.   Nunca recibi esta vacuna.  Si le han aplicado la vacuna contra el ttanos, el brazo podr hincharse, enrojecer y sentirse caliente al tacto. Esto es frecuente y no es un problema. Si usted necesita aplicarse la vacuna y se niega a recibirla, corre riesgo de contraer ttanos. sta es una enfermedad grave.  SOLICITE ATENCIN MDICA SI:   Presenta enrojecimiento, hinchazn o aumento del dolor en la herida.   Hay rayas rojas que salen de la herida.   Observa un lquido blanco amarillento (pus) en la herida.   Tiene fiebre.   Advierte un olor ftido que proviene de la herida o del vendaje.   La herida se abre luego de que le han extrado las suturas.   Nota que en la herida hay algn cuerpo extrao como un trozo de [place] o vidrio.   La herida est en su mano o pie y observa que no puede mover correctamente los dedos.  SOLICITE ATENCIN MDICA DE INMEDIATO SI:   El dolor no se alivia con los [employer].   Hay una zona muy hinchada alrededor de la herida que le causa dolor y adormecimiento, o advierte un cambio en el color en el brazo, la mano, la pierna o el pie.   La herida se abre y sangra nuevamente.   Siente que el adormecimiento, la debilidad o la prdida de la funcin de la articulacin que rodea la herida [place].   Palpa ndulos dolorosos cerca de la herida o bajo la piel en cualquier zona del cuerpo.  ASEGRESE DE QUE:    Comprende estas instrucciones.   Controlar su enfermedad.   Solicitar ayuda de inmediato si no mejora o si empeora.  Document Released: 04/14/2005 Document Revised: 04/03/2011 [facility] Patient Information 2012 [place], [place].  Colisin con un vehculo de motor Academic librarian) Luego de una colisin, es comn presentar mltiples moretones y Research scientist (life sciences). Estas molestias generalmente empeoran durante las primeras 24 horas. Usted gradualmente se pondr ms rgido y con ms dolor en las horas siguientes. Podr sentirse peor cuando despierte en la maana siguiente al accidente. A partir de all, debera comenzar a Risk manager que pase. La velocidad con que se mejora generalmente depende de la gravedad de la colisin, la cantidad de lesiones y la ubicacin y Firefighter de las mismas. INSTRUCCIONES PARA EL CUIDADO EN EL HOGAR   Aplique hielo sobre la zona lesionada.   Ponga el hielo en una bolsa plstica.   Colquese una toalla entre la piel y la bolsa de hielo.   Deje el hielo durante 15 a 20  minutos, 3 a 4 veces por da.   Debe ingerir gran cantidad de lquido para mantener la orina de tono claro o color amarillo plido.  No beba alcohol.   Tome una ducha o un bao caliente o bese una o dos veces por da. Esto aumentar el flujo de [employer] Sciences Corporation msculos doloridos.   Puede volver a sus ocupaciones cuando se lo indique el mdico. Tenga cuidado al levantar objetos, ya que puede agravar el dolor en el cuello o en la espalda.   Utilice los medicamentos de venta libre o de prescripcin para Chief Technology Officer, Environmental health practitioner o la [place], segn se lo indique el profesional que lo asiste. No tome aspirina. Podran aumentar los hematomas o las hemorragias.  SOLICITE ATENCIN MDICA DE INMEDIATO SI SIENTE:  Entumecimiento, hormigueo, debilidad o problemas con el uso de los brazos o las piernas.   Dolor de cabeza intenso que no mejora con medicamentos.   Siente dolor intenso en  el cuello, especialmente sensibilidad en el centro de la espalda o el cuello.   Cambios en el control del intestino o la vejiga.   Aumento del dolor en cualquier parte del cuerpo.   Falta de aire, mareos o [place].   Siente dolor en el pecho.   Nuseas, vmitos o sudoracin.   Aumento del Dentist abdominal.   Sangre en la orina, en las heces o vmitos con [place].   Siente dolor en los hombros (en la zona de los breteles).   Que sus sntomas empeoran.  EST SEGURO QUE:   Comprende las instrucciones para el alta mdica.   Controlar su enfermedad.   Solicitar atencin mdica de inmediato segn las indicaciones.  Document Released: 01/22/2005 Document Revised: 04/03/2011 [facility] Patient Information 2012 [place], [place].

## 2011-11-22 NOTE — ED Notes (Signed)
Pt complained of seeing black spots while ambulating BP 151/121

## 2011-11-22 NOTE — ED Notes (Signed)
Pt nauseated  Med given 

## 2011-11-23 ENCOUNTER — Emergency Department (HOSPITAL_COMMUNITY)
Admission: EM | Admit: 2011-11-23 | Discharge: 2011-11-23 | Disposition: A | Discharge status: Home / Self Care | Payer: Self-pay | Attending: Emergency Medicine | Admitting: Emergency Medicine

## 2011-11-23 ENCOUNTER — Emergency Department (HOSPITAL_COMMUNITY): Payer: Self-pay

## 2011-11-23 ENCOUNTER — Encounter (HOSPITAL_COMMUNITY): Payer: Self-pay | Admitting: *Deleted

## 2011-11-23 DIAGNOSIS — Y93I9 Activity, other involving external motion: Secondary | ICD-10-CM | POA: Insufficient documentation

## 2011-11-23 DIAGNOSIS — E119 Type 2 diabetes mellitus without complications: Secondary | ICD-10-CM | POA: Insufficient documentation

## 2011-11-23 DIAGNOSIS — S0190XA Unspecified open wound of unspecified part of head, initial encounter: Secondary | ICD-10-CM | POA: Insufficient documentation

## 2011-11-23 DIAGNOSIS — Y998 Other external cause status: Secondary | ICD-10-CM | POA: Insufficient documentation

## 2011-11-23 DIAGNOSIS — F0781 Postconcussional syndrome: Secondary | ICD-10-CM

## 2011-11-23 DIAGNOSIS — R51 Headache: Secondary | ICD-10-CM

## 2011-11-23 DIAGNOSIS — IMO0002 Reserved for concepts with insufficient information to code with codable children: Secondary | ICD-10-CM

## 2011-11-23 DIAGNOSIS — J45909 Unspecified asthma, uncomplicated: Secondary | ICD-10-CM | POA: Insufficient documentation

## 2011-11-23 MED ORDER — HYDROCODONE-ACETAMINOPHEN 5-325 MG PO TABS: 1.0000 | ORAL_TABLET | Freq: Four times a day (QID) | ORAL | Status: AC | PRN

## 2011-11-23 MED ORDER — OXYCODONE-ACETAMINOPHEN 5-325 MG PO TABS
1.0000 | ORAL_TABLET | Freq: Once | ORAL | Status: AC
  Administered 2011-11-23: 1 via ORAL
  Filled 2011-11-23: qty 1

## 2011-11-23 MED ORDER — IBUPROFEN 800 MG PO TABS: 800.0000 mg | ORAL_TABLET | Freq: Three times a day (TID) | ORAL | Status: AC

## 2011-11-23 MED ORDER — KETOROLAC TROMETHAMINE 60 MG/2ML IM SOLN
60.0000 mg | Freq: Once | INTRAMUSCULAR | Status: AC
  Administered 2011-11-23: 60 mg via INTRAMUSCULAR
  Filled 2011-11-23: qty 2
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
## 2011-11-23 NOTE — ED Notes (Signed)
Patient was given ordered medication. Discharge instruction were given to patient and family. Patient verbalized understanding. Patient discharged in stable condition.

## 2011-11-23 NOTE — ED Notes (Signed)
Patient transported to CT 

## 2011-11-24 NOTE — ED Provider Notes (Signed)
Medical screening examination/treatment/procedure(s) were performed by non-physician practitioner and as supervising physician I was immediately available for consultation/collaboration.   Carleene Cooper III, MD 11/24/11 1357

## 2012-01-14 ENCOUNTER — Inpatient Hospital Stay (HOSPITAL_COMMUNITY)
Admission: AD | Admit: 2012-01-14 | Discharge: 2012-01-14 | Disposition: A | Discharge status: Home / Self Care | Payer: Self-pay | Source: Ambulatory Visit | Attending: Obstetrics & Gynecology | Admitting: Obstetrics & Gynecology

## 2012-01-14 ENCOUNTER — Inpatient Hospital Stay (HOSPITAL_COMMUNITY): Payer: Self-pay

## 2012-01-14 ENCOUNTER — Encounter (HOSPITAL_COMMUNITY): Payer: Self-pay | Admitting: *Deleted

## 2012-01-14 DIAGNOSIS — Z3201 Encounter for pregnancy test, result positive: Secondary | ICD-10-CM | POA: Insufficient documentation

## 2012-01-14 DIAGNOSIS — R109 Unspecified abdominal pain: Secondary | ICD-10-CM | POA: Insufficient documentation

## 2012-01-14 LAB — URINALYSIS, ROUTINE W REFLEX MICROSCOPIC
Glucose, UA: NEGATIVE mg/dL
Hgb urine dipstick: NEGATIVE
Leukocytes, UA: NEGATIVE
pH: 6.5 (ref 5.0–8.0)

## 2012-01-14 LAB — CBC
Platelets: 228 10*3/uL (ref 150–400)
RDW: 12.3 % (ref 11.5–15.5)
WBC: 10 10*3/uL (ref 4.0–10.5)

## 2012-01-14 LAB — ABO/RH: ABO/RH(D): O POS

## 2012-01-14 LAB — WET PREP, GENITAL
Trich, Wet Prep: NONE SEEN
Yeast Wet Prep HPF POC: NONE SEEN

## 2012-01-14 LAB — POCT PREGNANCY, URINE: Preg Test, Ur: POSITIVE — AB

## 2012-01-14 NOTE — MAU Note (Signed)
Pt states she had a post home UPT test today-has had abd pain across the middle of her abd x 1 week

## 2012-01-20 ENCOUNTER — Encounter (HOSPITAL_COMMUNITY): Payer: Self-pay

## 2012-01-20 ENCOUNTER — Inpatient Hospital Stay (HOSPITAL_COMMUNITY)
Admission: AD | Admit: 2012-01-20 | Discharge: 2012-01-21 | Disposition: A | Discharge status: Home / Self Care | Payer: Self-pay | Source: Ambulatory Visit | Attending: Obstetrics and Gynecology | Admitting: Obstetrics and Gynecology

## 2012-01-20 DIAGNOSIS — O418X90 Other specified disorders of amniotic fluid and membranes, unspecified trimester, not applicable or unspecified: Secondary | ICD-10-CM

## 2012-01-20 DIAGNOSIS — R109 Unspecified abdominal pain: Secondary | ICD-10-CM | POA: Insufficient documentation

## 2012-01-20 DIAGNOSIS — O468X9 Other antepartum hemorrhage, unspecified trimester: Secondary | ICD-10-CM

## 2012-01-20 DIAGNOSIS — O209 Hemorrhage in early pregnancy, unspecified: Secondary | ICD-10-CM | POA: Insufficient documentation

## 2012-01-20 DIAGNOSIS — Z3490 Encounter for supervision of normal pregnancy, unspecified, unspecified trimester: Secondary | ICD-10-CM

## 2012-01-20 NOTE — MAU Note (Signed)
Pt G3 P2 at 13.1wks having cramping x 2 wks.  Denies bleeding.

## 2012-01-21 ENCOUNTER — Encounter (HOSPITAL_COMMUNITY): Payer: Self-pay | Admitting: Advanced Practice Midwife

## 2012-01-21 ENCOUNTER — Inpatient Hospital Stay (HOSPITAL_COMMUNITY): Payer: Self-pay

## 2012-01-21 DIAGNOSIS — O418X90 Other specified disorders of amniotic fluid and membranes, unspecified trimester, not applicable or unspecified: Secondary | ICD-10-CM | POA: Diagnosis present

## 2012-01-21 DIAGNOSIS — Z3490 Encounter for supervision of normal pregnancy, unspecified, unspecified trimester: Secondary | ICD-10-CM

## 2012-01-21 DIAGNOSIS — Z1389 Encounter for screening for other disorder: Secondary | ICD-10-CM

## 2012-01-21 DIAGNOSIS — O468X9 Other antepartum hemorrhage, unspecified trimester: Secondary | ICD-10-CM | POA: Diagnosis present

## 2012-01-21 NOTE — MAU Provider Note (Signed)
Chief Complaint: Abdominal Cramping   First Provider Initiated Contact with Patient 01/21/12 0009     SUBJECTIVE HPI: [name] is a 21 y.o. [id number] at [redacted]w[redacted]d by LMP who presents to maternity admissions reporting abdominal cramping described as low and in the middle of her abdomen.  She has had cramping x2 weeks.  She was seen in MAU on 01/14/12 and was supposed to come in 2 days later for repeat labs but was not able to come in.  She denies LOF, vaginal bleeding, vaginal itching/burning, urinary symptoms, h/a, dizziness, n/v, or fever/chills.     Past Medical History  Diagnosis Date  . Asthma   . Seasonal allergies   . Gall stones    Past Surgical History  Procedure Date  . Cholecystectomy   . No past surgeries    History   Social History  . Marital Status: Divorced    Spouse Name: N/A    Number of Children: N/A  . Years of Education: N/A   Occupational History  . Not on file.   Social History Main Topics  . Smoking status: Never Smoker   . Smokeless tobacco: Not on file  . Alcohol Use: No  . Drug Use: No  . Sexually Active: Yes    Birth Control/ Protection: Injection   Other Topics Concern  . Not on file   Social History Narrative  . No narrative on file   No current facility-administered medications on file prior to encounter.   Current Outpatient Prescriptions on File Prior to Encounter  Medication Sig Dispense Refill  . PediaSure (PEDIASURE) LIQD Take 1 Can by mouth daily.      [name] albuterol (PROVENTIL HFA;VENTOLIN HFA) 108 (90 BASE) MCG/ACT inhaler Inhale 2 puffs into the lungs every 6 (six) hours as needed. For shortness of breath       Allergies  Allergen Reactions  . Avocado Anaphylaxis  . Strawberry Hives, Shortness Of Breath and Swelling    ROS: Pertinent items in HPI  OBJECTIVE Blood pressure 111/59, pulse 75, temperature 97.8 F (36.6 C), temperature source Oral, resp. rate 16, height 5\' 2"  (1.575 m), weight 69.854 kg (154 lb), last  menstrual period 10/20/2011. GENERAL: Well-developed, well-nourished female in no acute distress.  HEENT: Normocephalic HEART: normal rate RESP: normal effort ABDOMEN: Soft, non-tender EXTREMITIES: Nontender, no edema NEURO: Alert and oriented SPECULUM EXAM: Deferred, done on 01/14/12  LAB RESULTS No results found for this or any previous visit (from the past 24 hour(s)).  IMAGING US Ob Transvaginal  01/21/2012  **ADDENDUM** CREATED: 01/21/2012 02:20:34  Yolk sac is now present.  This was not present on the prior examination.  This was inadvertently excluded from the findings on the initial dictation.  **END ADDENDUM** SIGNED BY: Andreas Newport, M.D.   01/21/2012  *RADIOLOGY REPORT*  Clinical Data: Cramping.  Positive pregnancy test.  Possible cornual ectopic pregnancy.  Follow-up from 01/14/2012.  TRANSVAGINAL OB ULTRASOUND  Technique:  Transvaginal ultrasound was performed for evaluation of the gestation as well as the maternal uterus and adnexal regions.  Comparison: 01/14/2012.  Findings: Single intrauterine gestational sac is identified.  The mean sac diameter is 8.6 mm, showing interval growth from 01/14/2012 when the sac measured 2.5 mm.  Sized today correlates with 5 weeks, 5 days.  No cardiac activity is visualized likely due to small size.  Small subchorionic hemorrhage is present. Small amount of fluid is present in the lower uterine segment, probably associated with the subchorionic hemorrhage.  Physiologic appearance of  the ovaries bilaterally.  Right ovary measures 42 mm x 27 mm x 28 mm.  Left ovary measures 35 mm x 25 mm x 17 mm.  No free fluid.  IMPRESSION: Interval growth of single intrauterine gestational sac. Estimated gestational age on today's exam is 5 weeks 5 days based on sac size.  No cornual or ectopic pregnancy identified.   Consider 10- day follow-up ultrasound recommended to assess for interval growth and presence of fetal pole.  Small subchorionic hemorrhage with fluid in  the lower uterine segment.  Original Report Authenticated By: Andreas Newport, M.D.     ASSESSMENT 1. Normal IUP (intrauterine pregnancy) on prenatal ultrasound   2. Subchorionic hemorrhage     PLAN Discharge home Discussed subchorionic hemorrhage and otherwise normal early U/S with pt Tylenol for pain Drink plenty of fluids F/U with early prenatal care Return to MAU as needed    Medication List     As of 01/21/2012 12:12 AM    ASK your doctor about these medications         albuterol 108 (90 BASE) MCG/ACT inhaler   Commonly known as: PROVENTIL HFA;VENTOLIN HFA   Inhale 2 puffs into the lungs every 6 (six) hours as needed. For shortness of breath      PediaSure Liqd   Take 1 Can by mouth daily.         Sharen Counter Certified Nurse-Midwife 01/21/2012  12:12 AM

## 2012-01-21 NOTE — MAU Provider Note (Signed)
Attestation of Attending Supervision of Advanced Practitioner (CNM/NP): Evaluation and management procedures were performed by the Advanced Practitioner under my supervision and collaboration.  I have reviewed the Advanced Practitioner's note and chart, and I agree with the management and plan.  Lemya Greenwell 01/21/2012 7:33 AM

## 2012-02-05 ENCOUNTER — Inpatient Hospital Stay (HOSPITAL_COMMUNITY)
Admission: AD | Admit: 2012-02-05 | Discharge: 2012-02-05 | Disposition: A | Discharge status: Home / Self Care | Payer: Self-pay | Source: Ambulatory Visit | Attending: Obstetrics & Gynecology | Admitting: Obstetrics & Gynecology

## 2012-02-05 ENCOUNTER — Encounter (HOSPITAL_COMMUNITY): Payer: Self-pay | Admitting: *Deleted

## 2012-02-05 DIAGNOSIS — N949 Unspecified condition associated with female genital organs and menstrual cycle: Secondary | ICD-10-CM

## 2012-02-05 DIAGNOSIS — R12 Heartburn: Secondary | ICD-10-CM | POA: Insufficient documentation

## 2012-02-05 DIAGNOSIS — R109 Unspecified abdominal pain: Secondary | ICD-10-CM | POA: Insufficient documentation

## 2012-02-05 DIAGNOSIS — O26899 Other specified pregnancy related conditions, unspecified trimester: Secondary | ICD-10-CM

## 2012-02-05 DIAGNOSIS — O99891 Other specified diseases and conditions complicating pregnancy: Secondary | ICD-10-CM | POA: Insufficient documentation

## 2012-02-05 LAB — URINALYSIS, ROUTINE W REFLEX MICROSCOPIC
Bilirubin Urine: NEGATIVE
Glucose, UA: NEGATIVE mg/dL
Hgb urine dipstick: NEGATIVE
Nitrite: NEGATIVE
Specific Gravity, Urine: 1.02 (ref 1.005–1.030)
pH: 8.5 — ABNORMAL HIGH (ref 5.0–8.0)

## 2012-02-05 MED ORDER — GI COCKTAIL ~~LOC~~
30.0000 mL | Freq: Once | ORAL | Status: AC
  Administered 2012-02-05: 30 mL via ORAL
  Filled 2012-02-05: qty 30

## 2012-02-05 MED ORDER — FAMOTIDINE 10 MG PO TABS: 20.0000 mg | ORAL_TABLET | Freq: Two times a day (BID) | ORAL | Status: DC

## 2012-02-05 MED ORDER — PROMETHAZINE HCL 25 MG PO TABS: 25.0000 mg | ORAL_TABLET | Freq: Four times a day (QID) | ORAL | Status: DC | PRN

## 2012-02-05 NOTE — MAU Provider Note (Signed)
Chief Complaint: Anxiety and Abdominal Pain   First Provider Initiated Contact with Patient 02/05/12 2256      SUBJECTIVE HPI: [name] is a 21 y.o. G3P1102 at [redacted]w[redacted]d by LMP who presents with anxious, and having upper abd burning and one episode of sharp low abd pain yesterday. Resolved. IUP verified by [place] 01/21/12. Denies VB. Has not tried anything for discomforts. Has not started [facility]. Plans to apply for [employer].  Past Medical History  Diagnosis Date  . Asthma   . Seasonal allergies   . Gall stones    OB History    Grav Para Term Preterm Abortions TAB SAB Ect Mult Living   3 2 1 1      2      # Outc Date GA Lbr Len/2nd Wgt Sex Del Anes PTL Lv   1 PRE            2 TRM            3 CUR              Past Surgical History  Procedure Date  . Cholecystectomy    History   Social History  . Marital Status: Divorced    Spouse Name: N/A    Number of Children: N/A  . Years of Education: N/A   Occupational History  . Not on file.   Social History Main Topics  . Smoking status: Never Smoker   . Smokeless tobacco: Not on file  . Alcohol Use: No  . Drug Use: No  . Sexually Active: Yes    Birth Control/ Protection: None   Other Topics Concern  . Not on file   Social History Narrative  . No narrative on file   No current facility-administered medications on file prior to encounter.   Current Outpatient Prescriptions on File Prior to Encounter  Medication Sig Dispense Refill  . PediaSure (PEDIASURE) LIQD Take 1 Can by mouth daily.      [name] albuterol (PROVENTIL HFA;VENTOLIN HFA) 108 (90 BASE) MCG/ACT inhaler Inhale 2 puffs into the lungs every 6 (six) hours as needed. For shortness of breath,rescue inhaler      . famotidine (PEPCID AC) 10 MG tablet Take 2 tablets (20 mg total) by mouth 2 (two) times daily.  60 tablet  6  . promethazine (PHENERGAN) 25 MG tablet Take 1 tablet (25 mg total) by mouth every 6 (six) hours as needed for nausea.  30 tablet  1    Allergies  Allergen Reactions  . Avocado Anaphylaxis  . Strawberry Hives, Shortness Of Breath and Swelling    ROS: Pertinent items in HPI  OBJECTIVE Blood pressure 108/65, pulse 73, temperature 97.7 F (36.5 C), temperature source Oral, resp. rate 18, height 5\' 1"  (1.549 m), weight 69.491 kg (153 lb 3.2 oz), last menstrual period 10/20/2011. GENERAL: Well-developed, well-nourished female in no acute distress.  HEENT: Normocephalic HEART: normal rate RESP: normal effort ABDOMEN: Soft, non-tender. EXTREMITIES: Nontender, no edema NEURO: Alert and oriented SPECULUM EXAM: deferred BIMANUAL: cervic closed and long, No VB  LAB RESULTS Results for orders placed during the hospital encounter of 02/05/12 (from the past 24 hour(s))  URINALYSIS, ROUTINE W REFLEX MICROSCOPIC     Status: Abnormal   Collection Time   02/05/12  9:40 PM      Component Value Range   Color, Urine YELLOW  YELLOW   APPearance CLEAR  CLEAR   Specific Gravity, Urine 1.020  1.005 - 1.030   pH 8.5 (*)  5.0 - 8.0   Glucose, UA NEGATIVE  NEGATIVE mg/dL   Hgb urine dipstick NEGATIVE  NEGATIVE   Bilirubin Urine NEGATIVE  NEGATIVE   Ketones, ur NEGATIVE  NEGATIVE mg/dL   Protein, ur NEGATIVE  NEGATIVE mg/dL   Urobilinogen, UA 0.2  0.0 - 1.0 mg/dL   Nitrite NEGATIVE  NEGATIVE   Leukocytes, UA NEGATIVE  NEGATIVE    IMAGING 7.5 week SIUP w/ pos cardiac activity by informal BS [place].  MAU COURSE Upper abd burning resolved w/ GI Cocktail.  ASSESSMENT 1. Heartburn in pregnancy   2. Round ligament pain    PLAN Discharge home     Follow-up Information    Follow up with [facility].      Follow up with [facility] [place] [facility]. (As needed if symptoms worsen)    Contact information:   [address] [id number] mc [place] [address] [phone]          Medication List     As of 02/06/2012  6:21 AM    TAKE these medications          albuterol 108 (90 BASE) MCG/ACT inhaler   Commonly known as: PROVENTIL HFA;VENTOLIN HFA   Inhale 2 puffs into the lungs every 6 (six) hours as needed. For shortness of breath,rescue inhaler      famotidine 10 MG tablet   Commonly known as: PEPCID   Take 2 tablets (20 mg total) by mouth 2 (two) times daily.      PediaSure Liqd   Take 1 Can by mouth daily.      promethazine 25 MG tablet   Commonly known as: PHENERGAN   Take 1 tablet (25 mg total) by mouth every 6 (six) hours as needed for nausea.         [place]asper, CNM 02/06/2012  6:21 AM

## 2012-02-05 NOTE — MAU Note (Signed)
Pt G3 P2 at 7.6wks feeling nervous, anxious, teary, and having upper abd burning.  Hx depression with first pregnancy.

## 2012-02-10 NOTE — MAU Provider Note (Signed)
Attestation of Attending Supervision of Advanced Practitioner (CNM/NP): Evaluation and management procedures were performed by the Advanced Practitioner under my supervision and collaboration.  I have reviewed the Advanced Practitioner's note and chart, and I agree with the management and plan.  Maleena Eddleman, MD, FACOG Attending Obstetrician & Gynecologist Faculty Practice, [facility]  

## 2012-03-21 ENCOUNTER — Emergency Department (HOSPITAL_COMMUNITY)
Admission: EM | Admit: 2012-03-21 | Discharge: 2012-03-23 | Disposition: A | Discharge status: Other Acute Inpatient Hospital | Payer: No Typology Code available for payment source | Attending: Emergency Medicine | Admitting: Emergency Medicine

## 2012-03-21 ENCOUNTER — Encounter (HOSPITAL_COMMUNITY): Payer: Self-pay | Admitting: *Deleted

## 2012-03-21 DIAGNOSIS — Y929 Unspecified place or not applicable: Secondary | ICD-10-CM | POA: Insufficient documentation

## 2012-03-21 DIAGNOSIS — Z8719 Personal history of other diseases of the digestive system: Secondary | ICD-10-CM | POA: Insufficient documentation

## 2012-03-21 DIAGNOSIS — F329 Major depressive disorder, single episode, unspecified: Secondary | ICD-10-CM

## 2012-03-21 DIAGNOSIS — F3289 Other specified depressive episodes: Secondary | ICD-10-CM | POA: Insufficient documentation

## 2012-03-21 DIAGNOSIS — Y939 Activity, unspecified: Secondary | ICD-10-CM | POA: Insufficient documentation

## 2012-03-21 DIAGNOSIS — O9989 Other specified diseases and conditions complicating pregnancy, childbirth and the puerperium: Secondary | ICD-10-CM | POA: Insufficient documentation

## 2012-03-21 DIAGNOSIS — J45909 Unspecified asthma, uncomplicated: Secondary | ICD-10-CM | POA: Insufficient documentation

## 2012-03-21 DIAGNOSIS — R45851 Suicidal ideations: Secondary | ICD-10-CM | POA: Insufficient documentation

## 2012-03-21 LAB — POCT PREGNANCY, URINE: Preg Test, Ur: POSITIVE — AB

## 2012-03-21 LAB — URINALYSIS, ROUTINE W REFLEX MICROSCOPIC
Leukocytes, UA: NEGATIVE
Nitrite: NEGATIVE
Specific Gravity, Urine: 1.012 (ref 1.005–1.030)
pH: 7 (ref 5.0–8.0)

## 2012-03-21 LAB — CBC WITH DIFFERENTIAL/PLATELET
Eosinophils Absolute: 0.1 10*3/uL (ref 0.0–0.7)
Hemoglobin: 13.7 g/dL (ref 12.0–15.0)
Lymphs Abs: 1.9 10*3/uL (ref 0.7–4.0)
MCH: 31.8 pg (ref 26.0–34.0)
Monocytes Relative: 8 % (ref 3–12)
Neutrophils Relative %: 73 % (ref 43–77)
RBC: 4.31 MIL/uL (ref 3.87–5.11)

## 2012-03-21 LAB — COMPREHENSIVE METABOLIC PANEL
Alkaline Phosphatase: 57 U/L (ref 39–117)
BUN: 5 mg/dL — ABNORMAL LOW (ref 6–23)
Chloride: 104 mEq/L (ref 96–112)
GFR calc Af Amer: >90 mL/min (ref >90)
Glucose, Bld: 85 mg/dL (ref 70–99)
Potassium: 3.9 mEq/L (ref 3.5–5.1)
Total Bilirubin: 0.4 mg/dL (ref 0.3–1.2)
Total Protein: 6.9 g/dL (ref 6.0–8.3)

## 2012-03-21 LAB — RAPID URINE DRUG SCREEN, HOSP PERFORMED: Opiates: NOT DETECTED

## 2012-03-21 MED ORDER — TETANUS-DIPHTH-ACELL PERTUSSIS 5-2.5-18.5 LF-MCG/0.5 IM SUSP
0.5000 mL | Freq: Once | INTRAMUSCULAR | Status: DC
  Filled 2012-03-21: qty 0.5

## 2012-03-21 MED ORDER — ACETAMINOPHEN 325 MG PO TABS
650.0000 mg | ORAL_TABLET | Freq: Once | ORAL | Status: AC
  Administered 2012-03-21: 650 mg via ORAL
  Filled 2012-03-21: qty 2

## 2012-03-21 MED ORDER — ACETAMINOPHEN 325 MG PO TABS
650.0000 mg | ORAL_TABLET | ORAL | Status: DC | PRN
  Administered 2012-03-22 – 2012-03-23 (×2): 650 mg via ORAL
  Filled 2012-03-21 (×3): qty 2

## 2012-03-21 MED ORDER — ONDANSETRON HCL 8 MG PO TABS
4.0000 mg | ORAL_TABLET | Freq: Three times a day (TID) | ORAL | Status: DC | PRN
Start: 2012-03-21 — End: 2012-03-23

## 2012-03-21 NOTE — ED Notes (Signed)
I gave the patient a pair of tan large socks. 
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
## 2012-03-21 NOTE — ED Notes (Signed)
I gave the patient 2 blankets.

## 2012-03-21 NOTE — ED Notes (Signed)
Pt knows that urine is needed 

## 2012-03-21 NOTE — ED Notes (Signed)
FHR 152

## 2012-03-21 NOTE — ED Notes (Signed)
Mental health, ACT Team at bedside.
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
## 2012-03-21 NOTE — ED Notes (Signed)
Last TDaP was on 10-2011. EDP informed.

## 2012-03-22 ENCOUNTER — Emergency Department (HOSPITAL_COMMUNITY): Payer: No Typology Code available for payment source

## 2012-03-22 LAB — POCT I-STAT, CHEM 8
HCT: 30 % — ABNORMAL LOW (ref 36.0–46.0)
Hemoglobin: 10.2 g/dL — ABNORMAL LOW (ref 12.0–15.0)
Potassium: 3.7 mEq/L (ref 3.5–5.1)
Sodium: 138 mEq/L (ref 135–145)

## 2012-03-22 LAB — CBC WITH DIFFERENTIAL/PLATELET
Basophils Relative: 0 % (ref 0–1)
Hemoglobin: 11.7 g/dL — ABNORMAL LOW (ref 12.0–15.0)
Lymphs Abs: 2.5 10*3/uL (ref 0.7–4.0)
Monocytes Relative: 7 % (ref 3–12)
Neutro Abs: 6.1 10*3/uL (ref 1.7–7.7)
Neutrophils Relative %: 64 % (ref 43–77)
RBC: 3.66 MIL/uL — ABNORMAL LOW (ref 3.87–5.11)
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
## 2012-03-22 NOTE — ED Notes (Signed)
Breakfast tray delivered and family member at bedside. Sitter at bedside.

## 2012-03-22 NOTE — ED Notes (Signed)
Pt back from scan 

## 2012-03-22 NOTE — ED Notes (Signed)
Ultrasound called here at [facility] pt informed.

## 2012-03-22 NOTE — ED Notes (Signed)
Pt eating meal, family at bedside, A&Ox4, stable. Pt has both forearms wrapped in gauze. Resting quietly, appropriate for situation.

## 2012-03-23 ENCOUNTER — Encounter (HOSPITAL_COMMUNITY): Payer: Self-pay

## 2012-03-23 ENCOUNTER — Inpatient Hospital Stay (HOSPITAL_COMMUNITY)
Admission: AD | Admit: 2012-03-23 | Discharge: 2012-03-24 | DRG: 881 | Disposition: A | Discharge status: Home / Self Care | Payer: Federal, State, Local not specified - Other | Source: Ambulatory Visit | Attending: Psychiatry | Admitting: Psychiatry

## 2012-03-23 DIAGNOSIS — J45909 Unspecified asthma, uncomplicated: Secondary | ICD-10-CM | POA: Diagnosis present

## 2012-03-23 DIAGNOSIS — F329 Major depressive disorder, single episode, unspecified: Secondary | ICD-10-CM

## 2012-03-23 DIAGNOSIS — F418 Other specified anxiety disorders: Secondary | ICD-10-CM | POA: Diagnosis present

## 2012-03-23 DIAGNOSIS — F341 Dysthymic disorder: Principal | ICD-10-CM | POA: Diagnosis present

## 2012-03-23 DIAGNOSIS — F4321 Adjustment disorder with depressed mood: Secondary | ICD-10-CM

## 2012-03-23 MED ORDER — MAGNESIUM HYDROXIDE 400 MG/5ML PO SUSP: 30.0000 mL | Freq: Every day | ORAL | Status: DC | PRN

## 2012-03-23 MED ORDER — ACETAMINOPHEN 325 MG PO TABS: 650.0000 mg | ORAL_TABLET | Freq: Four times a day (QID) | ORAL | Status: DC | PRN

## 2012-03-23 MED ORDER — ALUM & MAG HYDROXIDE-SIMETH 200-200-20 MG/5ML PO SUSP: 30.0000 mL | ORAL | Status: DC | PRN

## 2012-03-23 MED ORDER — ONDANSETRON HCL 4 MG PO TABS: 4.0000 mg | ORAL_TABLET | Freq: Three times a day (TID) | ORAL | Status: DC | PRN

## 2012-03-23 MED ORDER — ACETAMINOPHEN 325 MG PO TABS: 650.0000 mg | ORAL_TABLET | ORAL | Status: DC | PRN
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
## 2012-03-23 NOTE — Progress Notes (Signed)
Psychoeducational Group Note  Date:  03/23/2012 Time:  2030  Group Topic/Focus:  Wrap-Up Group:   The focus of this group is to help patients review their daily goal of treatment and discuss progress on daily workbooks.  Participation Level:  Minimal  Participation Quality:  Attentive and Resistant  Affect:  Depressed  Cognitive:  Oriented  Insight:  None  Engagement in Group:  Limited  Additional Comments: Patient reported that she attended at least one group session today and pronounced her name for this Clinical research associate.   [name], [name] 03/23/2012, 9:27 PM

## 2012-03-23 NOTE — Progress Notes (Signed)
D: Pt is participating  In unit activities--however it is minimal. Pt reports that her mother is currently caring for her 3 & 22 yo children.  Affect is sad ,but denies any self-harm thoughts , or HI & no AVH.No further c/o nausea.A: Pt supported & encouraged. Continues on 15 minute checks. R: Pt safety maintained.
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
## 2012-03-23 NOTE — Progress Notes (Signed)
Psychoeducational Group Note  Date:  03/23/2012 Time: 1100  Group Topic/Focus:  Recovery Goals:   The focus of this group is to identify appropriate goals for recovery and establish a plan to achieve them.  Participation Level:  None  Participation Quality:  Appropriate  Affect:  Appropriate  Cognitive:  Appropriate  Insight:  None  Engagement in Group:  None  Additional Comments:  Patient attended group and did not speak during group. Asked patient if she would like to share on  What recovery would look like for patient, patient remained quiet. [name] Brittini 03/23/2012, 3:00 PM
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
## 2012-03-23 NOTE — Progress Notes (Signed)
Psychoeducational Group Note  Date:  03/23/2012 Time:  1000  Group Topic/Focus:  Recovery Goals:   The focus of this group is to identify appropriate goals for recovery and establish a plan to achieve them.  Participation Level:  Active  Participation Quality:  Appropriate, Sharing and Supportive  Affect:  Appropriate  Cognitive:  Appropriate  Insight:  Good  Engagement in Group:  Good  Additional Comments:  none  Haeli Gerlich M 03/23/2012, 10:58 AM
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
## 2012-03-23 NOTE — Progress Notes (Signed)
Patient ID: [name], female   DOB: 1990/09/06, 21 y.o.   MRN: [id number] D-Patient reports she slept well and that her appetite is good and her energy level is normal.  She rates depression and hopelessness a 0.  She denies thoughts of hurting herself or others.  She would like to be discharged.  She signed a 72 hour request today at 1215. A- Talked with patient about discharge procedure.  R- Patient reported she vomited after eating  Lunch.  She attributes this ti her pregnancy.  Gave patient ginger ale and she felt better.

## 2012-03-23 NOTE — Progress Notes (Signed)
[facility] Adult Inpatient Family/Significant Other Suicide Prevention Education  Suicide Prevention Education:  Education Completed; with patient's mother, [name] at telephone number [phone] has been identified by the patient as the family member/significant other with whom the patient will be residing, and identified as the person(s) who will aid the patient in the event of a mental health crisis (suicidal ideations/suicide attempt).  With written consent from the patient, the family member/significant other has been provided the following suicide prevention education, prior to the and/or following the discharge of the patient.  The suicide prevention education provided includes the following:  Suicide risk factors  Suicide prevention and interventions  National Suicide Hotline telephone number  [facility] assessment telephone number  [facility] Emergency Assistance 911  [facility] and/or Residential Mobile Crisis Unit telephone number  Request made of family/significant other to:  Remove weapons (e.g., guns, rifles, knives), all items previously/currently identified as safety concern.    Remove drugs/medications (over-the-counter, prescriptions, illicit drugs), all items previously/currently identified as a safety concern.  The family member/significant other verbalizes understanding of the suicide prevention education information provided.  The family member/significant other agrees to remove the items of safety concern listed above.  [name] 03/23/2012, 12:19 PM

## 2012-03-24 DIAGNOSIS — F411 Generalized anxiety disorder: Secondary | ICD-10-CM

## 2012-03-24 DIAGNOSIS — F329 Major depressive disorder, single episode, unspecified: Secondary | ICD-10-CM

## 2012-03-24 MED ORDER — ALBUTEROL SULFATE HFA 108 (90 BASE) MCG/ACT IN AERS: 2.0000 | INHALATION_SPRAY | Freq: Four times a day (QID) | RESPIRATORY_TRACT | Status: DC | PRN
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
## 2012-03-24 NOTE — Social Work (Signed)
Interdisciplinary Treatment Plan Update (Adult)  Date:  03/24/2012  Time Reviewed:  6:41 AM    Progress in Treatment: Attending groups:   Yes   Participating in groups:  Yes Taking medication as prescribed: No medications due to being pregnant Tolerating medication:  None Family/Significant othe contact made: Contact made with family Patient understands diagnosis:  Yes Discussing patient identified problems/goals with staff: Yes Medical problems stabilized or resolved: Yes Denies suicidal/homicidal ideation: Yes Issues/concerns per patient self-inventory: None mentioned Other:  New problem(s) identified:  Reason for Continuation of Hospitalization: Patient being discharged  Interventions implemented related to continuation of hospitalization:  Medication mgement; safety checks q 15 mins; coping skills development  Additional comments: Patient is [redacted] weeks pregnant and the scheduled appointment with her obstetrician. Patient is discharging on no meds, says she feels better, and is discharging to her mother to take her home.  Estimated length of stay: Discharging today  Discharge Plan:  Outpatient follow up scheduled  New goal(s):  Review of initial/current patient goals per problem list:    1.  Goal(s): Eliminate SI/other thoughts of self harm   Met: Yes  Target date: d/c  As evidenced by: Patient will no longer endorse SI/HI or other thoughts of self harm.    2.  Goal (s):Reduce depression/anxiety  Met: Yes  Target date: d/c  As evidenced by: Patient will rate symptoms at four or below    3.  Goal(s):.stabilize on meds   Met:  Yes  Target date: d/c  As evidenced by: Patient will report being stabilized on medications - less symptomatic    4.  Goal(s): Refer for outpatient follow up   Met:  Yes  Target date: d/c  As evidenced by: Follow up appointment has been scheduled    Attendees: Patient; Ernesto Rutherford- Leslie Dales and  @TD  6:41 AM    Physican:  Patrick North, MD @TD  6:41 AM  Nursing:   03/24/2012 6:41 AM  Nursing: Carney Living, RN    @TD  6:41 AM  Clinical Social Worker:  [name], LCSW @TD  6:41 AM  Other: Armandina Stammer, PMH-NP  03/24/2012 6:41 AM   Other:         03/24/2012 6:41 AM Other:

## 2012-03-24 NOTE — [facility] Suicide Risk Assessment (Signed)
Suicide Risk Assessment  Discharge Assessment     Demographic Factors:  Female, Hispanic  Mental Status Per Nursing Assessment::   On Admission:   (Si no plan)  Current Mental Status by Physician: Alert and oriented to 4. Denies SI/HI/AH/VH.  Loss Factors: Loss of significant relationship  Historical Factors: Impulsivity  Risk Reduction Factors:   Responsible for children under 22 years of age, Living with another person, especially a relative and Positive social support  Continued Clinical Symptoms:  Depression:   Recent sense of peace/wellbeing  Cognitive Features That Contribute To Risk:  Cognitively intact   Suicide Risk:  Minimal: No identifiable suicidal ideation.  Patients presenting with no risk factors but with morbid ruminations; may be classified as minimal risk based on the severity of the depressive symptoms  Discharge Diagnoses:   AXIS I:  Depressive Disorder NOS AXIS II:  No diagnosis AXIS III:   Past Medical History  Diagnosis Date  . Asthma   . Seasonal allergies   . Gall stones    AXIS IV:  other psychosocial or environmental problems AXIS V:  61-70 mild symptoms  Plan Of Care/Follow-up recommendations:  Activity:  normal Diet:  normal  Is patient on multiple antipsychotic therapies at discharge:  No   Has Patient had three or more failed trials of antipsychotic monotherapy by history:  No  Recommended Plan for Multiple Antipsychotic Therapies: NA  Lior Hoen 03/24/2012, 10:32 AM

## 2012-03-24 NOTE — Progress Notes (Signed)
[facility] Case Management Discharge Plan:  Will you be returning to the same living situation after discharge: Yes,    At discharge, do you have transportation home?:Yes,    Do you have the ability to pay for your medications:Yes,     Interagency Information:     Release of information consent forms completed and in the chart;  Patient's signature needed at discharge.  Patient to Follow up at:  Follow-up Information    Follow up with [facility]. On 03/30/2012. (Appointment scheduled for Tuesday, December 3 at 8 AM.)    Contact information:   [address] [place], [address] [phone]         Patient denies SI/HI:   Yes,       Safety Planning and Suicide Prevention discussed:  Yes,     Barrier to discharge identified:No.  Summary and Recommendations: Patient's mother will pick her up at discharge and patient will return to her current home. Patient to followup with [facility].   [name] 03/24/2012, 11:15 AM

## 2012-03-24 NOTE — Progress Notes (Signed)
[facility] Group Notes:  (Counselor/Nursing/MHT/Case Management/Adjunct)  03/24/2012 1:14 PM  Type of Therapy:  Psychoeducational Skills  Participation Level:  Minimal  Participation Quality:  Inattentive  Affect:  Appropriate  Cognitive:  Appropriate and Oriented  Insight:  Limited  Engagement in Group:  Limited  Engagement in Therapy:  n/a  Modes of Intervention:  Activity, Education, Problem-solving, Socialization, Support and Prompting  Summary of Progress/Problems: [name] attended psychoeducational group on labels. [name] participated in an activity labeling self and peers and choose to label herself as a Engineer, civil (consulting) for the activity. [name] was quiet but spoke when called on while group discussed what labels are, how we use them, how they effect the way we think about and perceive the world, and listed positive and negative labels they have used or been called. [name] was given homework assignment to list 10 words she has been labeled and to find the reality of the situation/label.    Wandra Scot 03/24/2012, 1:14 PM

## 2012-03-24 NOTE — Progress Notes (Signed)
Discharge Note  D: Patient mood appropriate to the circumstance. She reported on self inventory sheet that her energy level is normal and ability to pay attention is good. Patient rated depression "2" and feelings of hopelessness "0".  A: Support and encouragement provided. Administered scheduled medications per MD orders. Discharge instructions given to patient. Returned belongings to patient.  R: Patient receptive. Patient verbalized understanding of discharge instructions. Denies SI/HI/AVH. Patient d/c without incident.

## 2012-03-26 ENCOUNTER — Inpatient Hospital Stay (HOSPITAL_COMMUNITY)
Admission: AD | Admit: 2012-03-26 | Discharge: 2012-03-27 | Disposition: A | Discharge status: Home / Self Care | Payer: No Typology Code available for payment source | Source: Ambulatory Visit | Attending: Family Medicine | Admitting: Family Medicine

## 2012-03-26 ENCOUNTER — Encounter (HOSPITAL_COMMUNITY): Payer: Self-pay

## 2012-03-26 DIAGNOSIS — O0930 Supervision of pregnancy with insufficient antenatal care, unspecified trimester: Secondary | ICD-10-CM | POA: Insufficient documentation

## 2012-03-26 DIAGNOSIS — O99891 Other specified diseases and conditions complicating pregnancy: Secondary | ICD-10-CM | POA: Insufficient documentation

## 2012-03-26 DIAGNOSIS — M549 Dorsalgia, unspecified: Secondary | ICD-10-CM | POA: Insufficient documentation

## 2012-03-26 DIAGNOSIS — R109 Unspecified abdominal pain: Secondary | ICD-10-CM | POA: Insufficient documentation

## 2012-03-26 LAB — WET PREP, GENITAL
Trich, Wet Prep: NONE SEEN
Yeast Wet Prep HPF POC: NONE SEEN

## 2012-03-26 LAB — URINALYSIS, ROUTINE W REFLEX MICROSCOPIC
Bilirubin Urine: NEGATIVE
Ketones, ur: NEGATIVE mg/dL
Nitrite: NEGATIVE
Protein, ur: NEGATIVE mg/dL
pH: 7 (ref 5.0–8.0)

## 2012-03-26 MED ORDER — ACETAMINOPHEN 325 MG PO TABS
650.0000 mg | ORAL_TABLET | Freq: Once | ORAL | Status: AC
  Administered 2012-03-26: 650 mg via ORAL
  Filled 2012-03-26: qty 2

## 2012-03-26 MED ORDER — PRENATAL VITAMINS PLUS 27-1 MG PO TABS: 1.0000 | ORAL_TABLET | Freq: Every day | ORAL | Status: DC
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
## 2012-03-26 NOTE — MAU Note (Signed)
States today had some brown d/c
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
## 2012-03-27 NOTE — MAU Provider Note (Signed)
Chart reviewed and agree with management and plan.  

## 2012-03-29 NOTE — Progress Notes (Signed)
Patient Discharge Instructions:  After Visit Summary (AVS):   Faxed to:  03/29/12 Psychiatric Admission Assessment Note:   Faxed to:  03/29/12 Suicide Risk Assessment - Discharge Assessment:   Faxed to:  03/29/12 Faxed/Sent to the Next Level Care provider:  03/29/12 Faxed to [facility] @ [phone] Jerelene Redden, 03/29/2012, 4:53 PM

## 2012-03-30 NOTE — H&P (Signed)
Patient seen and assessed. Agree with key elements of above H&P.

## 2012-03-30 NOTE — Discharge Summary (Signed)
Reviewed

## 2012-04-28 NOTE — L&D Delivery Note (Signed)
Delivery Note At  a viable unspecified sex was delivered via  (Presentation: ;  ).  APGAR: , ; weight .   Placenta status: , .  Cord:  with the following complications: .  Cord pH: not done  Anesthesia: Epidural  Episiotomy:  Lacerations:  Suture Repair: 2.0 Est. Blood Loss (mL):   Mom to postpartum.  Baby to nursery-stable.  MARSHALL,BERNARD A 09/17/2012, 12:34 PM

## 2012-05-28 ENCOUNTER — Inpatient Hospital Stay (HOSPITAL_COMMUNITY)
Admission: AD | Admit: 2012-05-28 | Discharge: 2012-05-28 | Disposition: A | Discharge status: Home / Self Care | Payer: Self-pay | Source: Ambulatory Visit | Attending: Obstetrics | Admitting: Obstetrics

## 2012-05-28 ENCOUNTER — Encounter (HOSPITAL_COMMUNITY): Payer: Self-pay | Admitting: Family

## 2012-05-28 DIAGNOSIS — O9989 Other specified diseases and conditions complicating pregnancy, childbirth and the puerperium: Secondary | ICD-10-CM

## 2012-05-28 DIAGNOSIS — O2390 Unspecified genitourinary tract infection in pregnancy, unspecified trimester: Secondary | ICD-10-CM | POA: Insufficient documentation

## 2012-05-28 DIAGNOSIS — N390 Urinary tract infection, site not specified: Secondary | ICD-10-CM | POA: Insufficient documentation

## 2012-05-28 DIAGNOSIS — R109 Unspecified abdominal pain: Secondary | ICD-10-CM | POA: Insufficient documentation

## 2012-05-28 DIAGNOSIS — O2340 Unspecified infection of urinary tract in pregnancy, unspecified trimester: Secondary | ICD-10-CM

## 2012-05-28 LAB — URINALYSIS, ROUTINE W REFLEX MICROSCOPIC
Ketones, ur: 15 mg/dL — AB
Nitrite: POSITIVE — AB
Protein, ur: NEGATIVE mg/dL
pH: 5.5 (ref 5.0–8.0)

## 2012-05-28 LAB — URINE MICROSCOPIC-ADD ON

## 2012-05-28 LAB — WET PREP, GENITAL: Clue Cells Wet Prep HPF POC: NONE SEEN

## 2012-05-28 MED ORDER — NITROFURANTOIN MONOHYD MACRO 100 MG PO CAPS: 100.0000 mg | ORAL_CAPSULE | Freq: Two times a day (BID) | ORAL | Status: AC

## 2012-05-28 NOTE — MAU Note (Signed)
Patient presents with c/o intermittent lower back pain radiating bilaterally to lower abdomen since 0100 today, awakened from sleep. Denies vaginal bleeding, LOF.  Reports foul odor with thick yellow-brown vaginal discharge. Reports + fetal movement.

## 2012-05-28 NOTE — MAU Note (Signed)
Patient states she has had abdominal and back pain since last night. Has nausea, no vomiting. Reports fetal movement. Has a slight discharge, no bleeding or leaking.

## 2012-05-28 NOTE — MAU Provider Note (Signed)
Chief Complaint:  Abdominal Pain and Back Pain   First Provider Initiated Contact with Patient 05/28/12 1834      HPI: [name] is a 22 y.o. [id number] pt of Dr Gaynell Face at 40w1dwho presents to maternity admissions reporting cramping starting last night and continuing all day today.  Pt reports some thick white discharge with no odor but denies itching/burning with this. She reports good fetal movement, denies LOF, vaginal bleeding, urinary symptoms, h/a, dizziness, n/v, or fever/chills.     Past Medical History: Past Medical History  Diagnosis Date  . Asthma   . Seasonal allergies   . Gall stones     Past obstetric history: OB History    Grav Para Term Preterm Abortions TAB SAB Ect Mult Living   3 2 1 1      2      # Outc Date GA Lbr Len/2nd Wgt Sex Del Anes PTL Lv   1 TRM 2008 [redacted]w[redacted]d   F SVD EPI No    2 PRE 2010 [redacted]w[redacted]d   M LTCS EPI Yes Yes   Comments: NICU x 4 weeks   3 CUR               Past Surgical History: Past Surgical History  Procedure Date  . Cholecystectomy   . Cesarean section   . Cholecystectomy     Family History: History reviewed. No pertinent family history.  Social History: History  Substance Use Topics  . Smoking status: Never Smoker   . Smokeless tobacco: Never Used  . Alcohol Use: No    Allergies:  Allergies  Allergen Reactions  . Avocado Anaphylaxis  . Strawberry Hives, Shortness Of Breath and Swelling    Meds:  Prescriptions prior to admission  Medication Sig Dispense Refill  . albuterol (PROVENTIL HFA;VENTOLIN HFA) 108 (90 BASE) MCG/ACT inhaler Inhale 2 puffs into the lungs every 6 (six) hours as needed. For shortness of breath,rescue inhaler      . calcium carbonate (TUMS - DOSED IN MG ELEMENTAL CALCIUM) 500 MG chewable tablet Chew 2 tablets by mouth daily as needed. For heartburn      . Prenatal Vit-Fe Fumarate-FA (PRENATAL MULTIVITAMIN) TABS Take 1 tablet by mouth daily.        ROS: Pertinent findings in history of  present illness.  Physical Exam  Blood pressure 111/55, pulse 101, temperature 98.2 F (36.8 C), temperature source Oral, resp. rate 16, height 5\' 1"  (1.549 m), weight 71.033 kg (156 lb 9.6 oz), last menstrual period 10/20/2011, SpO2 99.00%. GENERAL: Well-developed, well-nourished female in no acute distress.  HEENT: normocephalic HEART: normal rate RESP: normal effort ABDOMEN: Soft, non-tender, gravid appropriate for gestational age EXTREMITIES: Nontender, no edema NEURO: alert and oriented Pelvic exam: Cervix pink, visually closed, without lesion, slightly friable with cotton swab, scant white creamy discharge, vaginal walls and external genitalia normal Cervix 0/long/high, soft, posterior     FHT:  Baseline 135, moderate variability, accelerations present, no decelerations Contractions: None on toco or to palpation   Labs: pending  Report to Georges Mouse, CNM  Sharen Counter Certified Nurse-Midwife 05/28/2012 7:45 PM  Results for orders placed during the hospital encounter of 05/28/12 (from the past 24 hour(s))  WET PREP, GENITAL     Status: Abnormal   Collection Time   05/28/12  7:20 PM      Component Value Range   Yeast Wet Prep HPF POC NONE SEEN  NONE SEEN   Trich, Wet Prep NONE SEEN  NONE SEEN  Clue Cells Wet Prep HPF POC NONE SEEN  NONE SEEN   WBC, Wet Prep HPF POC FEW (*) NONE SEEN  URINALYSIS, ROUTINE W REFLEX MICROSCOPIC     Status: Abnormal   Collection Time   05/28/12  8:23 PM      Component Value Range   Color, Urine YELLOW  YELLOW   APPearance HAZY (*) CLEAR   Specific Gravity, Urine 1.025  1.005 - 1.030   pH 5.5  5.0 - 8.0   Glucose, UA NEGATIVE  NEGATIVE mg/dL   Hgb urine dipstick NEGATIVE  NEGATIVE   Bilirubin Urine NEGATIVE  NEGATIVE   Ketones, ur 15 (*) NEGATIVE mg/dL   Protein, ur NEGATIVE  NEGATIVE mg/dL   Urobilinogen, UA 0.2  0.0 - 1.0 mg/dL   Nitrite POSITIVE (*) NEGATIVE   Leukocytes, UA SMALL (*) NEGATIVE  URINE MICROSCOPIC-ADD ON      Status: Abnormal   Collection Time   05/28/12  8:23 PM      Component Value Range   Squamous Epithelial / LPF RARE  RARE   WBC, UA 7-10  <3 WBC/hpf   RBC / HPF 3-6  <3 RBC/hpf   Bacteria, UA MANY (*) RARE    A/P: 1. Abdominal cramping complicating pregnancy   2. UTI in pregnancy       Medication List     As of 05/29/2012  2:54 AM    START taking these medications         nitrofurantoin (macrocrystal-monohydrate) 100 MG capsule   Commonly known as: MACROBID   Take 1 capsule (100 mg total) by mouth 2 (two) times daily.      CONTINUE taking these medications         albuterol 108 (90 BASE) MCG/ACT inhaler   Commonly known as: PROVENTIL HFA;VENTOLIN HFA   Inhale 2 puffs into the lungs every 6 (six) hours as needed. For shortness of breath,rescue inhaler      calcium carbonate 500 MG chewable tablet   Commonly known as: TUMS - dosed in mg elemental calcium      prenatal multivitamin Tabs          Where to get your medications    These are the prescriptions that you need to pick up. We sent them to a specific pharmacy, so you will need to go there to get them.   [facility] [address], [place] - [address] AT [facility] [address]    [address] [place] [address]    Phone: [phone]    Hours: 24-hours        nitrofurantoin (macrocrystal-monohydrate) 100 MG capsule            Follow-up Information    Follow up with MARSHALL,BERNARD A, MD. (Next week.  Return to MAU as needed.)    Contact information:   [address] [place] [address] [phone]

## 2012-05-30 LAB — URINE CULTURE

## 2012-06-30 LAB — OB RESULTS CONSOLE ABO/RH: RH Type: POSITIVE

## 2012-06-30 LAB — OB RESULTS CONSOLE RPR: RPR: NONREACTIVE

## 2012-06-30 LAB — OB RESULTS CONSOLE RUBELLA ANTIBODY, IGM: Rubella: IMMUNE

## 2012-08-09 ENCOUNTER — Inpatient Hospital Stay (HOSPITAL_COMMUNITY)
Admission: AD | Admit: 2012-08-09 | Discharge: 2012-08-09 | Disposition: A | Discharge status: Home / Self Care | Payer: Self-pay | Source: Ambulatory Visit | Attending: Obstetrics | Admitting: Obstetrics

## 2012-08-09 ENCOUNTER — Encounter (HOSPITAL_COMMUNITY): Payer: Self-pay | Admitting: *Deleted

## 2012-08-09 DIAGNOSIS — Z3689 Encounter for other specified antenatal screening: Secondary | ICD-10-CM

## 2012-08-09 DIAGNOSIS — O99891 Other specified diseases and conditions complicating pregnancy: Secondary | ICD-10-CM | POA: Insufficient documentation

## 2012-08-09 DIAGNOSIS — R05 Cough: Secondary | ICD-10-CM | POA: Insufficient documentation

## 2012-08-09 DIAGNOSIS — R059 Cough, unspecified: Secondary | ICD-10-CM | POA: Insufficient documentation

## 2012-08-09 DIAGNOSIS — Y92009 Unspecified place in unspecified non-institutional (private) residence as the place of occurrence of the external cause: Secondary | ICD-10-CM | POA: Insufficient documentation

## 2012-08-09 DIAGNOSIS — Y9301 Activity, walking, marching and hiking: Secondary | ICD-10-CM | POA: Insufficient documentation

## 2012-08-09 DIAGNOSIS — W19XXXA Unspecified fall, initial encounter: Secondary | ICD-10-CM

## 2012-08-09 DIAGNOSIS — M549 Dorsalgia, unspecified: Secondary | ICD-10-CM | POA: Insufficient documentation

## 2012-08-09 DIAGNOSIS — J302 Other seasonal allergic rhinitis: Secondary | ICD-10-CM

## 2012-08-09 DIAGNOSIS — J3489 Other specified disorders of nose and nasal sinuses: Secondary | ICD-10-CM | POA: Insufficient documentation

## 2012-08-09 DIAGNOSIS — W108XXA Fall (on) (from) other stairs and steps, initial encounter: Secondary | ICD-10-CM | POA: Insufficient documentation

## 2012-08-09 DIAGNOSIS — J029 Acute pharyngitis, unspecified: Secondary | ICD-10-CM | POA: Insufficient documentation

## 2012-08-09 LAB — URINALYSIS, ROUTINE W REFLEX MICROSCOPIC
Glucose, UA: NEGATIVE mg/dL
Leukocytes, UA: NEGATIVE
Specific Gravity, Urine: 1.02 (ref 1.005–1.030)
pH: 6.5 (ref 5.0–8.0)

## 2012-08-09 MED ORDER — ACETAMINOPHEN 500 MG PO TABS
1000.0000 mg | ORAL_TABLET | Freq: Once | ORAL | Status: AC
  Administered 2012-08-09: 1000 mg via ORAL
  Filled 2012-08-09: qty 2

## 2012-08-09 NOTE — MAU Note (Signed)
Pt states she has congested nose, scratchy throat & ears.  Reports temp of 102 @ home this a.m.

## 2012-08-09 NOTE — MAU Provider Note (Signed)
History     CSN: [id number]  Arrival date and time: 08/09/12 1441   First Provider Initiated Contact with Patient 08/09/12 1542      Chief Complaint  Patient presents with  . Back Pain   HPI [name] is a 22 y.o. [phone] at [redacted]w[redacted]d who presents to MAU today with complaint of back pain. The patient states that she fell down 2 stairs yesterday around 3 pm. She reports good fetal movement, although less today than prior to fall. Pain is all across the lower back. Patient states that she has been having seasonal allergy symptoms x 1 week. She was unsure of what she was allowed to take in pregnancy. She reports nasal congestion, sore throat and cough. She reports fever of 102F this morning. She did not take any medications. Afebrile now. Denies contractions, but states that she has a lot of cramping. She had some white discharge with a little spot of blood yesterday, none today.    OB History   Grav Para Term Preterm Abortions TAB SAB Ect Mult Living   3 2 1 1      2       Past Medical History  Diagnosis Date  . Asthma   . Seasonal allergies   . Gall stones     Past Surgical History  Procedure Laterality Date  . Cholecystectomy    . Cesarean section    . Cholecystectomy      History reviewed. No pertinent family history.  History  Substance Use Topics  . Smoking status: Never Smoker   . Smokeless tobacco: Never Used  . Alcohol Use: No    Allergies:  Allergies  Allergen Reactions  . Avocado Anaphylaxis  . Strawberry Hives, Shortness Of Breath and Swelling    Prescriptions prior to admission  Medication Sig Dispense Refill  . albuterol (PROVENTIL HFA;VENTOLIN HFA) 108 (90 BASE) MCG/ACT inhaler Inhale 2 puffs into the lungs every 6 (six) hours as needed. For shortness of breath,rescue inhaler      . Prenatal Vit-Fe Fumarate-FA (PRENATAL MULTIVITAMIN) TABS Take 1 tablet by mouth daily.      . [DISCONTINUED] calcium carbonate (TUMS - DOSED IN MG  ELEMENTAL CALCIUM) 500 MG chewable tablet Chew 2 tablets by mouth daily as needed. For heartburn        Review of Systems  Constitutional: Positive for fever. Negative for malaise/fatigue.  Gastrointestinal: Positive for nausea and abdominal pain. Negative for vomiting, diarrhea and constipation.  Genitourinary: Negative for dysuria, urgency, frequency and flank pain.       Neg - vaginal bleeding + Vaginal discharge  Neurological: Positive for dizziness and headaches.   Physical Exam   Blood pressure 104/65, pulse 97, temperature 97.8 F (36.6 C), temperature source Oral, resp. rate 20, last menstrual period 10/20/2011.  Physical Exam  Constitutional: She is oriented to person, place, and time. She appears well-developed and well-nourished. No distress.  HENT:  Head: Normocephalic and atraumatic.  Cardiovascular: Normal rate, regular rhythm and normal heart sounds.   Respiratory: Effort normal and breath sounds normal. No respiratory distress.  GI: Soft. Bowel sounds are normal. She exhibits no distension and no mass. There is tenderness (mild diffuse tenderness to palpation of the abdomen). There is no rebound and no guarding.  Musculoskeletal:       Lumbar back: She exhibits tenderness and pain. She exhibits normal range of motion, no bony tenderness, no swelling, no edema and no laceration.       Back:  Neurological: She is alert and oriented to person, place, and time.  Skin: Skin is warm and dry. No erythema.  Psychiatric: She has a normal mood and affect.   Results for orders placed during the hospital encounter of 08/09/12 (from the past 24 hour(s))  URINALYSIS, ROUTINE W REFLEX MICROSCOPIC     Status: None   Collection Time    08/09/12  3:28 PM      Result Value Range   Color, Urine YELLOW  YELLOW   APPearance CLEAR  CLEAR   Specific Gravity, Urine 1.020  1.005 - 1.030   pH 6.5  5.0 - 8.0   Glucose, UA NEGATIVE  NEGATIVE mg/dL   Hgb urine dipstick NEGATIVE  NEGATIVE     Bilirubin Urine NEGATIVE  NEGATIVE   Ketones, ur NEGATIVE  NEGATIVE mg/dL   Protein, ur NEGATIVE  NEGATIVE mg/dL   Urobilinogen, UA 0.2  0.0 - 1.0 mg/dL   Nitrite NEGATIVE  NEGATIVE   Leukocytes, UA NEGATIVE  NEGATIVE    Fetal Monitoring: Baseline: 120 bpm, moderate variability, + accelerations, no decelerations Contractions: none MAU Course  Procedures None  MDM Patient fell yesterday. Reports decreased FM today. Reactive NST. Patient denies active bleeding, may have seen a few specks in her discharge. Appointment for follow-up on Thursday with Dr. Gaynell Face.  Patient reports normal movement since in MAU.  Tylenol given for back pain while in MAU Assessment and Plan  A: Back pain Fall Reactive NST  P: Discharge home Bleeding and labor precautions discussed. AVS contains information in Spanish about kick counts Patient encouraged to keep next scheduled appointment with Dr. Gaynell Face Patient may take Tylenol PRN for pain. Ice to the affected area. Claritin for allergy symptoms.  Patient may return to MAU as needed or if her condition were to change or worsen  Freddi Starr, PA-C  08/09/2012, 4:46 PM

## 2012-08-09 NOTE — MAU Note (Signed)
Pt states she fell down two step yesterday, landed on her back, started having back pain.

## 2012-08-09 NOTE — MAU Note (Signed)
Low back pains, come and go.  Past wk- allergies, throat and sinus drainage

## 2012-08-23 ENCOUNTER — Encounter (HOSPITAL_COMMUNITY): Payer: Self-pay | Admitting: *Deleted

## 2012-08-23 ENCOUNTER — Inpatient Hospital Stay (HOSPITAL_COMMUNITY)
Admission: AD | Admit: 2012-08-23 | Discharge: 2012-08-23 | Disposition: A | Discharge status: Home / Self Care | Payer: Self-pay | Source: Ambulatory Visit | Attending: Obstetrics | Admitting: Obstetrics

## 2012-08-23 DIAGNOSIS — R0981 Nasal congestion: Secondary | ICD-10-CM

## 2012-08-23 DIAGNOSIS — O4700 False labor before 37 completed weeks of gestation, unspecified trimester: Secondary | ICD-10-CM | POA: Insufficient documentation

## 2012-08-23 DIAGNOSIS — J3489 Other specified disorders of nose and nasal sinuses: Secondary | ICD-10-CM | POA: Insufficient documentation

## 2012-08-23 DIAGNOSIS — J309 Allergic rhinitis, unspecified: Secondary | ICD-10-CM | POA: Insufficient documentation

## 2012-08-23 DIAGNOSIS — R0602 Shortness of breath: Secondary | ICD-10-CM | POA: Insufficient documentation

## 2012-08-23 DIAGNOSIS — O99891 Other specified diseases and conditions complicating pregnancy: Secondary | ICD-10-CM | POA: Insufficient documentation

## 2012-08-23 DIAGNOSIS — M545 Low back pain, unspecified: Secondary | ICD-10-CM | POA: Insufficient documentation

## 2012-08-23 DIAGNOSIS — M549 Dorsalgia, unspecified: Secondary | ICD-10-CM

## 2012-08-23 DIAGNOSIS — Z9109 Other allergy status, other than to drugs and biological substances: Secondary | ICD-10-CM

## 2012-08-23 LAB — URINALYSIS, ROUTINE W REFLEX MICROSCOPIC
Leukocytes, UA: NEGATIVE
Nitrite: NEGATIVE
Specific Gravity, Urine: 1.025 (ref 1.005–1.030)
Urobilinogen, UA: 2 mg/dL — ABNORMAL HIGH (ref 0.0–1.0)
pH: 6 (ref 5.0–8.0)

## 2012-08-23 MED ORDER — ALBUTEROL SULFATE (5 MG/ML) 0.5% IN NEBU
2.5000 mg | INHALATION_SOLUTION | Freq: Once | RESPIRATORY_TRACT | Status: AC
  Administered 2012-08-23: 2.5 mg via RESPIRATORY_TRACT

## 2012-08-23 MED ORDER — ALBUTEROL SULFATE (5 MG/ML) 0.5% IN NEBU
INHALATION_SOLUTION | RESPIRATORY_TRACT | Status: AC
  Filled 2012-08-23: qty 0.5

## 2012-08-23 MED ORDER — LORATADINE-PSEUDOEPHEDRINE ER 5-120 MG PO TB12: 1.0000 | ORAL_TABLET | Freq: Two times a day (BID) | ORAL | Status: DC

## 2012-08-23 MED ORDER — CYCLOBENZAPRINE HCL 10 MG PO TABS: 10.0000 mg | ORAL_TABLET | Freq: Two times a day (BID) | ORAL | Status: DC | PRN

## 2012-08-23 MED ORDER — CYCLOBENZAPRINE HCL 10 MG PO TABS
10.0000 mg | ORAL_TABLET | Freq: Once | ORAL | Status: AC
  Administered 2012-08-23: 10 mg via ORAL
  Filled 2012-08-23: qty 1

## 2012-08-23 MED ORDER — ACETAMINOPHEN-CODEINE #3 300-30 MG PO TABS: 1.0000 | ORAL_TABLET | Freq: Four times a day (QID) | ORAL | Status: DC | PRN

## 2012-08-23 MED ORDER — OXYCODONE-ACETAMINOPHEN 5-325 MG PO TABS
1.0000 | ORAL_TABLET | Freq: Once | ORAL | Status: AC
  Administered 2012-08-23: 1 via ORAL
  Filled 2012-08-23: qty 1

## 2012-08-23 NOTE — MAU Note (Signed)
Pt reports diarrhea x 2 today.

## 2012-08-23 NOTE — MAU Provider Note (Signed)
History     CSN: [id number]  Arrival date and time: 08/23/12 2102   First Provider Initiated Contact with Patient 08/23/12 2200      Chief Complaint  Patient presents with  . Fever  . Back Pain  . Nausea  . Diarrhea   HPI [name] is a 22 y.o. [phone] at [redacted]w[redacted]d who presents to MAU today with SOB secondary to allergies and asthma and low back pain. The patient states that both issues are making it hard for her to sleep. She rates her pain at 6/10 now. Pain is worse with ambulation. She is having occasional contractions, but the pain is mild. She is having some lower abdominal pressure with frequency of urination. She states that she had a fever or 103F at home today, afebrile here without medication. She denies vaginal bleeding, discharge, LOF. She reports good fetal movement.   OB History   Grav Para Term Preterm Abortions TAB SAB Ect Mult Living   3 2 1 1      2       Past Medical History  Diagnosis Date  . Asthma   . Seasonal allergies   . Gall stones     Past Surgical History  Procedure Laterality Date  . Cholecystectomy    . Cesarean section    . Cholecystectomy      History reviewed. No pertinent family history.  History  Substance Use Topics  . Smoking status: Never Smoker   . Smokeless tobacco: Never Used  . Alcohol Use: No    Allergies:  Allergies  Allergen Reactions  . Avocado Anaphylaxis  . Strawberry Hives, Shortness Of Breath and Swelling    Prescriptions prior to admission  Medication Sig Dispense Refill  . albuterol (PROVENTIL HFA;VENTOLIN HFA) 108 (90 BASE) MCG/ACT inhaler Inhale 2 puffs into the lungs every 6 (six) hours as needed for wheezing or shortness of breath.      . Prenatal Vit-Fe Fumarate-FA (PRENATAL MULTIVITAMIN) TABS Take 1 tablet by mouth daily.      . [DISCONTINUED] albuterol (PROVENTIL HFA;VENTOLIN HFA) 108 (90 BASE) MCG/ACT inhaler Inhale 2 puffs into the lungs every 6 (six) hours as needed. For shortness of  breath,rescue inhaler        Review of Systems  Constitutional: Positive for fever.  HENT: Positive for congestion.   Respiratory: Positive for shortness of breath.   Gastrointestinal: Positive for nausea, abdominal pain and diarrhea. Negative for vomiting and constipation.  Genitourinary: Positive for dysuria and frequency. Negative for urgency and flank pain.  Musculoskeletal: Positive for back pain and joint pain.   Physical Exam   Blood pressure 120/56, pulse 119, temperature 98.3 F (36.8 C), temperature source Oral, resp. rate 20, height 5\' 2"  (1.575 m), weight 170 lb 9.6 oz (77.384 kg), last menstrual period 10/20/2011, SpO2 97.00%.  Physical Exam  Constitutional: She is oriented to person, place, and time. She appears well-developed and well-nourished. No distress.  HENT:  Head: Normocephalic and atraumatic.  Cardiovascular: Regular rhythm and normal heart sounds.  Tachycardia present.   Respiratory: Effort normal and breath sounds normal. No respiratory distress. She has no wheezes.  GI: Soft. Bowel sounds are normal. She exhibits no distension and no mass. There is tenderness (mild diffuse tenderness to palpation). There is no rebound and no guarding.  Neurological: She is alert and oriented to person, place, and time.  Skin: Skin is warm and dry. No erythema.  Psychiatric: She has a normal mood and affect.  Dilation: Closed  Effacement (%): Thick Station: -2 Exam by:: Gaye Alken RN  Results for orders placed during the hospital encounter of 08/23/12 (from the past 24 hour(s))  URINALYSIS, ROUTINE W REFLEX MICROSCOPIC     Status: Abnormal   Collection Time    08/23/12  9:20 PM      Result Value Range   Color, Urine YELLOW  YELLOW   APPearance CLEAR  CLEAR   Specific Gravity, Urine 1.025  1.005 - 1.030   pH 6.0  5.0 - 8.0   Glucose, UA NEGATIVE  NEGATIVE mg/dL   Hgb urine dipstick NEGATIVE  NEGATIVE   Bilirubin Urine NEGATIVE  NEGATIVE   Ketones, ur NEGATIVE   NEGATIVE mg/dL   Protein, ur NEGATIVE  NEGATIVE mg/dL   Urobilinogen, UA 2.0 (*) 0.0 - 1.0 mg/dL   Nitrite NEGATIVE  NEGATIVE   Leukocytes, UA NEGATIVE  NEGATIVE    Fetal Monitoring: Baseline: 130 bpm, moderate variability, + accelerations, no decelerations Contractions: occasional  MAU Course  Procedures None  MDM Flexeril and Percocet given in MAU - patient reports pain is 3-4/10 now Albuterol nebulizer treatment given. Little relief. Patient is not wheezing and lung fields are clear.  SOB secondary to nasal congestion more likely, will send Rx for Claritin. Patient sounds congested and does not appear to be in any respiratory distress No signs of UTI. Urinary symptoms are more likely pregnancy related. Has follow-up in the office this week. Pyelo symptoms discussed Assessment and Plan  A: Allergic rhinitis with nasal congestion Back pain in pregnancy  P: Discharge home  Rx for Flexeril, tylenol #3 and claritin sent to patient's pharmacy Patient encouraged to increase oral hydration as tolerated Labor precautions discussed Patient encouraged to keep follow-up with Dr. Gaynell Face later this week Patient may return to MAU as needed or if her condition were to change or worsen  Freddi Starr, PA-C  08/23/2012, 10:00 PM

## 2012-08-23 NOTE — MAU Note (Signed)
Pt G3 P2 at 36.4wks reports back pain and fever of 100.3 since Sunday.  Having nausea and diarrhea today.

## 2012-09-13 ENCOUNTER — Inpatient Hospital Stay (HOSPITAL_COMMUNITY)
Admission: AD | Admit: 2012-09-13 | Discharge: 2012-09-13 | Disposition: A | Discharge status: Home / Self Care | Payer: Self-pay | Source: Ambulatory Visit | Attending: Obstetrics | Admitting: Obstetrics

## 2012-09-13 DIAGNOSIS — O479 False labor, unspecified: Secondary | ICD-10-CM | POA: Insufficient documentation

## 2012-09-13 NOTE — MAU Note (Signed)
Pt states contractions are every 20 mn but really hard when they come

## 2012-09-16 ENCOUNTER — Inpatient Hospital Stay (HOSPITAL_COMMUNITY)
Admission: AD | Admit: 2012-09-16 | Discharge: 2012-09-20 | DRG: 775 | Disposition: A | Discharge status: Home / Self Care | Payer: Medicaid Other | Source: Ambulatory Visit | Attending: Obstetrics | Admitting: Obstetrics

## 2012-09-16 ENCOUNTER — Encounter (HOSPITAL_COMMUNITY): Payer: Self-pay | Admitting: *Deleted

## 2012-09-16 ENCOUNTER — Inpatient Hospital Stay (HOSPITAL_COMMUNITY): Payer: Medicaid Other

## 2012-09-16 DIAGNOSIS — O321XX0 Maternal care for breech presentation, not applicable or unspecified: Secondary | ICD-10-CM | POA: Diagnosis present

## 2012-09-16 DIAGNOSIS — O368190 Decreased fetal movements, unspecified trimester, not applicable or unspecified: Principal | ICD-10-CM | POA: Diagnosis present

## 2012-09-16 LAB — OB RESULTS CONSOLE HIV ANTIBODY (ROUTINE TESTING): HIV: NONREACTIVE

## 2012-09-16 LAB — TYPE AND SCREEN

## 2012-09-16 LAB — CBC
Hemoglobin: 11.1 g/dL — ABNORMAL LOW (ref 12.0–15.0)
MCH: 25.8 pg — ABNORMAL LOW (ref 26.0–34.0)
MCHC: 32.7 g/dL (ref 30.0–36.0)
MCV: 78.8 fL (ref 78.0–100.0)
Platelets: 254 10*3/uL (ref 150–400)
RBC: 4.3 MIL/uL (ref 3.87–5.11)

## 2012-09-16 MED ORDER — LACTATED RINGERS IV SOLN: 500.0000 mL | INTRAVENOUS | Status: DC | PRN

## 2012-09-16 MED ORDER — OXYTOCIN 40 UNITS IN LACTATED RINGERS INFUSION - SIMPLE MED
1.0000 m[IU]/min | INTRAVENOUS | Status: DC
  Administered 2012-09-16: 2 m[IU]/min via INTRAVENOUS
  Filled 2012-09-16: qty 1000

## 2012-09-16 MED ORDER — ACETAMINOPHEN 325 MG PO TABS: 650.0000 mg | ORAL_TABLET | ORAL | Status: DC | PRN

## 2012-09-16 MED ORDER — FLEET ENEMA 7-19 GM/118ML RE ENEM: 1.0000 | ENEMA | RECTAL | Status: DC | PRN

## 2012-09-16 MED ORDER — TERBUTALINE SULFATE 1 MG/ML IJ SOLN: 0.2500 mg | Freq: Once | INTRAMUSCULAR | Status: AC | PRN

## 2012-09-16 MED ORDER — OXYTOCIN 40 UNITS IN LACTATED RINGERS INFUSION - SIMPLE MED
62.5000 mL/h | INTRAVENOUS | Status: DC
  Administered 2012-09-17: 62.5 mL/h via INTRAVENOUS

## 2012-09-16 MED ORDER — IBUPROFEN 600 MG PO TABS
600.0000 mg | ORAL_TABLET | Freq: Four times a day (QID) | ORAL | Status: DC | PRN
  Administered 2012-09-17 – 2012-09-18 (×2): 600 mg via ORAL
  Filled 2012-09-16 (×9): qty 1

## 2012-09-16 MED ORDER — LIDOCAINE HCL (PF) 1 % IJ SOLN
30.0000 mL | INTRAMUSCULAR | Status: DC | PRN
  Filled 2012-09-16 (×2): qty 30

## 2012-09-16 MED ORDER — OXYTOCIN BOLUS FROM INFUSION: 500.0000 mL | INTRAVENOUS | Status: DC

## 2012-09-16 MED ORDER — LACTATED RINGERS IV SOLN
INTRAVENOUS | Status: DC
  Administered 2012-09-16: 125 mL/h via INTRAVENOUS
  Administered 2012-09-17 (×2): via INTRAVENOUS

## 2012-09-16 MED ORDER — CITRIC ACID-SODIUM CITRATE 334-500 MG/5ML PO SOLN: 30.0000 mL | ORAL | Status: DC | PRN

## 2012-09-16 MED ORDER — ONDANSETRON HCL 4 MG/2ML IJ SOLN
4.0000 mg | Freq: Four times a day (QID) | INTRAMUSCULAR | Status: DC | PRN
  Administered 2012-09-17: 4 mg via INTRAVENOUS
  Filled 2012-09-16: qty 2

## 2012-09-16 MED ORDER — BUTORPHANOL TARTRATE 1 MG/ML IJ SOLN
1.0000 mg | INTRAMUSCULAR | Status: DC | PRN
  Administered 2012-09-17: 1 mg via INTRAVENOUS
  Filled 2012-09-16: qty 1

## 2012-09-16 MED ORDER — OXYCODONE-ACETAMINOPHEN 5-325 MG PO TABS
1.0000 | ORAL_TABLET | ORAL | Status: DC | PRN
  Administered 2012-09-18: 1 via ORAL
  Filled 2012-09-16 (×3): qty 1
  Filled 2012-09-16: qty 2
  Filled 2012-09-16: qty 1

## 2012-09-16 NOTE — MAU Provider Note (Signed)
[name] is a 22 y.o. G3P1102 at [redacted]w[redacted]d sent from the office for NST with decreased fetal movement. No pain or bleeding, no LOF. Uncomplicated prenatal course.   BP 94/60  Pulse 95  Resp 18  LMP 10/20/2011  Gen: well Abd: soft, nontender  EFM: Baseline:150 Variability:moderate Accels: 10x10 present, one 15x15 Decels: early  Toco:irreg  MSE complete RN to report to Dr. Gaynell Face for plan of care

## 2012-09-16 NOTE — MAU Note (Signed)
Presents from office for decrease FM x 3 days

## 2012-09-17 ENCOUNTER — Encounter (HOSPITAL_COMMUNITY): Payer: Self-pay

## 2012-09-17 ENCOUNTER — Encounter (HOSPITAL_COMMUNITY): Payer: Self-pay | Admitting: Anesthesiology

## 2012-09-17 ENCOUNTER — Inpatient Hospital Stay (HOSPITAL_COMMUNITY): Payer: Medicaid Other | Admitting: Anesthesiology

## 2012-09-17 MED ORDER — IBUPROFEN 600 MG PO TABS
600.0000 mg | ORAL_TABLET | Freq: Four times a day (QID) | ORAL | Status: DC
  Administered 2012-09-17 – 2012-09-20 (×9): 600 mg via ORAL
  Filled 2012-09-17 (×3): qty 1

## 2012-09-17 MED ORDER — WITCH HAZEL-GLYCERIN EX PADS: 1.0000 "application " | MEDICATED_PAD | CUTANEOUS | Status: DC | PRN

## 2012-09-17 MED ORDER — ALBUTEROL SULFATE HFA 108 (90 BASE) MCG/ACT IN AERS: 2.0000 | INHALATION_SPRAY | Freq: Four times a day (QID) | RESPIRATORY_TRACT | Status: DC | PRN

## 2012-09-17 MED ORDER — PNEUMOCOCCAL VAC POLYVALENT 25 MCG/0.5ML IJ INJ
0.5000 mL | INJECTION | INTRAMUSCULAR | Status: AC
  Administered 2012-09-18: 0.5 mL via INTRAMUSCULAR
  Filled 2012-09-17: qty 0.5

## 2012-09-17 MED ORDER — PHENYLEPHRINE 40 MCG/ML (10ML) SYRINGE FOR IV PUSH (FOR BLOOD PRESSURE SUPPORT)
80.0000 ug | PREFILLED_SYRINGE | INTRAVENOUS | Status: DC | PRN
  Filled 2012-09-17: qty 2

## 2012-09-17 MED ORDER — PHENYLEPHRINE 40 MCG/ML (10ML) SYRINGE FOR IV PUSH (FOR BLOOD PRESSURE SUPPORT)
80.0000 ug | PREFILLED_SYRINGE | INTRAVENOUS | Status: DC | PRN
  Filled 2012-09-17: qty 2
  Filled 2012-09-17: qty 5

## 2012-09-17 MED ORDER — FERROUS SULFATE 325 (65 FE) MG PO TABS
325.0000 mg | ORAL_TABLET | Freq: Two times a day (BID) | ORAL | Status: DC
  Administered 2012-09-17 – 2012-09-20 (×6): 325 mg via ORAL
  Filled 2012-09-17 (×6): qty 1

## 2012-09-17 MED ORDER — EPHEDRINE 5 MG/ML INJ
10.0000 mg | INTRAVENOUS | Status: DC | PRN
  Filled 2012-09-17: qty 4
  Filled 2012-09-17: qty 2

## 2012-09-17 MED ORDER — LANOLIN HYDROUS EX OINT: TOPICAL_OINTMENT | CUTANEOUS | Status: DC | PRN

## 2012-09-17 MED ORDER — DIBUCAINE 1 % RE OINT: 1.0000 "application " | TOPICAL_OINTMENT | RECTAL | Status: DC | PRN

## 2012-09-17 MED ORDER — ONDANSETRON HCL 4 MG/2ML IJ SOLN: 4.0000 mg | INTRAMUSCULAR | Status: DC | PRN

## 2012-09-17 MED ORDER — DIPHENHYDRAMINE HCL 25 MG PO CAPS: 25.0000 mg | ORAL_CAPSULE | Freq: Four times a day (QID) | ORAL | Status: DC | PRN

## 2012-09-17 MED ORDER — OXYCODONE-ACETAMINOPHEN 5-325 MG PO TABS
1.0000 | ORAL_TABLET | ORAL | Status: DC | PRN
  Administered 2012-09-17 – 2012-09-20 (×4): 1 via ORAL
  Filled 2012-09-17: qty 1

## 2012-09-17 MED ORDER — LACTATED RINGERS IV SOLN
500.0000 mL | Freq: Once | INTRAVENOUS | Status: AC
  Administered 2012-09-17: 500 mL via INTRAVENOUS

## 2012-09-17 MED ORDER — BENZOCAINE-MENTHOL 20-0.5 % EX AERO
1.0000 "application " | INHALATION_SPRAY | CUTANEOUS | Status: DC | PRN
  Administered 2012-09-18: 1 via TOPICAL
  Filled 2012-09-17: qty 56

## 2012-09-17 MED ORDER — ZOLPIDEM TARTRATE 5 MG PO TABS: 5.0000 mg | ORAL_TABLET | Freq: Every evening | ORAL | Status: DC | PRN

## 2012-09-17 MED ORDER — ONDANSETRON HCL 4 MG PO TABS: 4.0000 mg | ORAL_TABLET | ORAL | Status: DC | PRN

## 2012-09-17 MED ORDER — SENNOSIDES-DOCUSATE SODIUM 8.6-50 MG PO TABS
2.0000 | ORAL_TABLET | Freq: Every day | ORAL | Status: DC
  Administered 2012-09-17 – 2012-09-18 (×2): 2 via ORAL

## 2012-09-17 MED ORDER — PRENATAL MULTIVITAMIN CH
1.0000 | ORAL_TABLET | Freq: Every day | ORAL | Status: DC
  Administered 2012-09-18 – 2012-09-19 (×2): 1 via ORAL
  Filled 2012-09-17 (×2): qty 1

## 2012-09-17 MED ORDER — FENTANYL 2.5 MCG/ML BUPIVACAINE 1/10 % EPIDURAL INFUSION (WH - ANES)
14.0000 mL/h | INTRAMUSCULAR | Status: DC | PRN
  Administered 2012-09-17: 14 mL/h via EPIDURAL
  Filled 2012-09-17: qty 125

## 2012-09-17 MED ORDER — TETANUS-DIPHTH-ACELL PERTUSSIS 5-2.5-18.5 LF-MCG/0.5 IM SUSP
0.5000 mL | Freq: Once | INTRAMUSCULAR | Status: AC
  Administered 2012-09-18: 0.5 mL via INTRAMUSCULAR
  Filled 2012-09-17: qty 0.5

## 2012-09-17 MED ORDER — DIPHENHYDRAMINE HCL 50 MG/ML IJ SOLN: 12.5000 mg | INTRAMUSCULAR | Status: DC | PRN

## 2012-09-17 MED ORDER — EPHEDRINE 5 MG/ML INJ
10.0000 mg | INTRAVENOUS | Status: DC | PRN
  Filled 2012-09-17: qty 2

## 2012-09-17 MED ORDER — SIMETHICONE 80 MG PO CHEW: 80.0000 mg | CHEWABLE_TABLET | ORAL | Status: DC | PRN

## 2012-09-17 MED ORDER — LIDOCAINE HCL (PF) 1 % IJ SOLN
INTRAMUSCULAR | Status: DC | PRN
  Administered 2012-09-17 (×2): 5 mL

## 2012-09-17 NOTE — Progress Notes (Signed)
Patient ID: [name], female   DOB: 06/16/1990, 22 y.o.   MRN: [id number] Is 3 cm 80% vertex -2 chosen IUPC and she is contracting every 2 minutes

## 2012-09-17 NOTE — Anesthesia Procedure Notes (Signed)
Epidural Patient location during procedure: OB Start time: 09/17/2012 5:19 AM  Staffing Anesthesiologist: Angus Seller., Harrell Gave. Performed by: anesthesiologist   Preanesthetic Checklist Completed: patient identified, site marked, surgical consent, pre-op evaluation, timeout performed, IV checked, risks and benefits discussed and monitors and equipment checked  Epidural Patient position: sitting Prep: site prepped and draped and DuraPrep Patient monitoring: continuous pulse ox and blood pressure Approach: midline Injection technique: LOR air and LOR saline  Needle:  Needle type: Tuohy  Needle gauge: 17 G Needle length: 9 cm and 9 Needle insertion depth: 6 cm Catheter type: closed end flexible Catheter size: 19 Gauge Catheter at skin depth: 11 cm Test dose: negative  Assessment Events: blood not aspirated, injection not painful, no injection resistance, negative IV test and no paresthesia  Additional Notes Patient identified.  Risk benefits discussed including failed block, incomplete pain control, headache, nerve damage, paralysis, blood pressure changes, nausea, vomiting, reactions to medication both toxic or allergic, and postpartum back pain.  Patient expressed understanding and wished to proceed.  All questions were answered.  Sterile technique used throughout procedure and epidural site dressed with sterile barrier dressing. No paresthesia or other complications noted.The patient did not experience any signs of intravascular injection such as tinnitus or metallic taste in mouth nor signs of intrathecal spread such as rapid motor block. Please see nursing notes for vital signs.

## 2012-09-17 NOTE — H&P (Signed)
This is Dr. Francoise Ceo dictating the history and physical on  [name] she's a 22 year old gravida 3 para [id number] negative GBS at 40 weeks and 1 day her EDC is 09/15/2012 she was seen in the office with a history of decreased fetal movement for the past 3 days she had a nonstress test which was nonreactive and a BPP  8 of  8 it was decided  She would be  admit admitted for delivery   the cervix was 1 cm 50% with the vertex at -3 station she was started on Pitocin and she is now 3 cm 70% vertex -1 station she had a C-section in the past because of a breech presentation Past medical history negative Past surgical history C-section in the past x1 Social history negative System review noncontributory Physical exam well-developed female in labor HEENT negative Lungs clear to P&A Breasts negative Heart regular rhythm no murmurs no gallops Abdomen term Pelvic as described above Extremities negative and

## 2012-09-17 NOTE — Anesthesia Preprocedure Evaluation (Signed)
Anesthesia Evaluation  Patient identified by MRN, date of birth, ID band Patient awake    Reviewed: Allergy & Precautions, H&P , Patient's Chart, lab work & pertinent test results  Airway Mallampati: II TM Distance: >3 FB Neck ROM: full    Dental no notable dental hx.    Pulmonary neg pulmonary ROS, asthma ,  breath sounds clear to auscultation  Pulmonary exam normal       Cardiovascular negative cardio ROS  Rhythm:regular Rate:Normal     Neuro/Psych negative neurological ROS  negative psych ROS   GI/Hepatic negative GI ROS, Neg liver ROS,   Endo/Other  negative endocrine ROS  Renal/GU negative Renal ROS     Musculoskeletal   Abdominal   Peds  Hematology negative hematology ROS (+)   Anesthesia Other Findings Asthma     Seasonal allergies        Gall stones    Reproductive/Obstetrics (+) Pregnancy                           Anesthesia Physical Anesthesia Plan  ASA: II  Anesthesia Plan: Epidural   Post-op Pain Management:    Induction:   Airway Management Planned:   Additional Equipment:   Intra-op Plan:   Post-operative Plan:   Informed Consent: I have reviewed the patients History and Physical, chart, labs and discussed the procedure including the risks, benefits and alternatives for the proposed anesthesia with the patient or authorized representative who has indicated his/her understanding and acceptance.     Plan Discussed with:   Anesthesia Plan Comments:         Anesthesia Quick Evaluation

## 2012-09-17 NOTE — Anesthesia Postprocedure Evaluation (Signed)
Anesthesia Post Note  Patient: [name]  Procedure(s) Performed: * No procedures listed *  Anesthesia type: Epidural  Patient location: Mother/Baby  Post pain: Pain level controlled  Post assessment: Post-op Vital signs reviewed  Last Vitals:  Filed Vitals:   09/17/12 1520  BP: 93/64  Pulse: 69  Temp: 36.7 C  Resp: 20    Post vital signs: Reviewed  Level of consciousness:alert  Complications: No apparent anesthesia complications

## 2012-09-17 NOTE — Progress Notes (Signed)
Delivery of live viable female by Dr Marshall. APGARS 8,9  

## 2012-09-18 LAB — CBC
HCT: 28.7 % — ABNORMAL LOW (ref 36.0–46.0)
Platelets: 211 10*3/uL (ref 150–400)
RBC: 3.57 MIL/uL — ABNORMAL LOW (ref 3.87–5.11)
RDW: 15.5 % (ref 11.5–15.5)
WBC: 14.6 10*3/uL — ABNORMAL HIGH (ref 4.0–10.5)

## 2012-09-18 MED ORDER — LACTATED RINGERS IV SOLN: Freq: Once | INTRAVENOUS | Status: DC

## 2012-09-18 NOTE — Progress Notes (Signed)
States dizzy laying and sitting. Feels at times things going dark. Denies headache or blurred vision. Fundus firm, minimal bleeding. Orthostatic V/S obtained. Will call MD.

## 2012-09-18 NOTE — Progress Notes (Signed)
Post Partum Day 1 Subjective: no complaints  Objective: Blood pressure 108/59, pulse 67, temperature 98.3 F (36.8 C), temperature source Oral, resp. rate 20, height 5\' 2"  (1.575 m), weight 172 lb (78.019 kg), last menstrual period 10/20/2011, SpO2 100.00%, unknown if currently breastfeeding.  Physical Exam:  General: alert and no distress Lochia: appropriate Uterine Fundus: firm Incision: healing well DVT Evaluation: No evidence of DVT seen on physical exam.   Recent Labs  09/16/12 1820 09/18/12 0650  HGB 11.1* 9.2*  HCT 33.9* 28.7*    Assessment/Plan: Plan for discharge tomorrow   LOS: 2 days   [name] A 09/18/2012, 11:45 PM

## 2012-09-18 NOTE — Progress Notes (Signed)
Dr. Clearance Coots notified of status. New orders received.

## 2012-09-19 LAB — COMPREHENSIVE METABOLIC PANEL
AST: 14 U/L (ref 0–37)
Albumin: 2.1 g/dL — ABNORMAL LOW (ref 3.5–5.2)
Alkaline Phosphatase: 168 U/L — ABNORMAL HIGH (ref 39–117)
CO2: 24 mEq/L (ref 19–32)
Chloride: 106 mEq/L (ref 96–112)
GFR calc non Af Amer: >90 mL/min (ref >90)
Potassium: 3.6 mEq/L (ref 3.5–5.1)
Total Bilirubin: 0.2 mg/dL — ABNORMAL LOW (ref 0.3–1.2)

## 2012-09-19 LAB — CBC
HCT: 30 % — ABNORMAL LOW (ref 36.0–46.0)
Platelets: 227 10*3/uL (ref 150–400)
RBC: 3.74 MIL/uL — ABNORMAL LOW (ref 3.87–5.11)
RDW: 15.6 % — ABNORMAL HIGH (ref 11.5–15.5)
WBC: 13.4 10*3/uL — ABNORMAL HIGH (ref 4.0–10.5)

## 2012-09-19 LAB — KLEIHAUER-BETKE STAIN: Quantitation Fetal Hemoglobin: 115 mL

## 2012-09-19 NOTE — Progress Notes (Signed)
Post Partum Day 2 Subjective: C/O dizziness.  Objective: Blood pressure 108/59, pulse 67, temperature 98.3 F (36.8 C), temperature source Oral, resp. rate 20, height 5\' 2"  (1.575 m), weight 172 lb (78.019 kg), last menstrual period 10/20/2011, SpO2 100.00%, unknown if currently breastfeeding.  Physical Exam:  General: alert and no distress Lochia: appropriate Uterine Fundus: firm Incision: healing well DVT Evaluation: No evidence of DVT seen on physical exam.   Recent Labs  09/16/12 1820 09/18/12 0650  HGB 11.1* 9.2*  HCT 33.9* 28.7*    Assessment/Plan: Dizzy with ambulation.  Check CBC.  Monitor closely.   LOS: 3 days   Helios Kohlmann A 09/19/2012, 6:00 AM

## 2012-09-19 NOTE — Lactation Note (Signed)
This note was copied from the chart of [name]. Lactation Consultation Note  Patient Name: [name] [id number] Date: 09/19/2012 Reason for consult: Follow-up assessment   Maternal Data    Feeding   LATCH Score/Interventions                      Lactation Tools Discussed/Used     Consult Status Consult Status: Complete  Mom reports that she pumped 2 times yesterday and is just obtaining a few drops of Colostrum. Reports that she nursed the baby 4 times yesterday. States she is worried that he is not getting enough milk so is giving formula too. Encouraged to nurse as much as possible as that is best way to promote milk supply. This is mom's 3rd baby and she has BF the other babies for more than a year each. She has appointment with [facility] on Tuesday. Plans to use manual pump if she is away from baby. No questions at present. To call prn    [name] 09/19/2012, 10:04 AM

## 2012-09-19 NOTE — Progress Notes (Signed)
States still dizzy. Doesn't appear to act as if dizzy. ambulating in rm. W/o difficulty. When pt in rm. All she wants to do is sleep. I explained that maybe the percocett can be causing the dizziness, lets just try the Ibuprofen for the pain. Heating pad given for back discomfort. Stated helpful.

## 2012-09-19 NOTE — Clinical Social Work Note (Signed)
Clinical Social Work Department PSYCHOSOCIAL ASSESSMENT - MATERNAL/CHILD 09/19/2012  Patient:  [name],[name]  Account Number:  [id number]  Admit Date:  09/16/2012  Childs Name:   [name]    Clinical Social Worker:  Carmesha Morocco, LCSW   Date/Time:  09/19/2012 11:00 AM  Date Referred:  09/19/2012   Referral source  Physician     Referred reason  NICU   Other referral source:    I:  FAMILY / HOME ENVIRONMENT Child's legal guardian:  PARENT  Guardian - Name Guardian - Age Guardian - Address  [name] 22 [address] [place], [place] [address]  [name]  [address] [place], [place] [address]   Other household support members/support persons Name Relationship DOB  6 yo BROTHER   4 yo SISTER    Other support:   MOB and FOB report good family support in area    II  PSYCHOSOCIAL DATA Information Source:  Patient Interview  Financial and Community Resources Employment:   MOB unemployed  FOB works in construction   Financial resources:  Medicaid If Medicaid - County:  [place] Other  WIC  Food Stamps   School / Grade:   Maternity Care Coordinator / Child Services Coordination / Early Interventions:  Cultural issues impacting care:   FOB speaks limited english    III  STRENGTHS Strengths  Compliance with medical plan  Supportive family/friends  Adequate Resources  Home prepared for Child (including basic supplies)  Understanding of illness   Strength comment:    IV  RISK FACTORS AND CURRENT PROBLEMS Current Problem:  None   Risk Factor & Current Problem Patient Issue Family Issue Risk Factor / Current Problem Comment   N N     V  SOCIAL WORK ASSESSMENT CSW spoke with MOB and FOB at bedside.  CSW introduced department and discussed offering support to NICU families. CSW discussed infant admission to NICU and illness.  MOB expressed good communication and knowledge of treatment. CSW discussed emotional stability.  MOB  reports appropriate emotion around NICU admission.  No hx of emotional concerns for MOB in chart.  CSW discussed symptoms of PPD and MOB expressed she had information on this.  CSW discussed supplies and family support.  MOB and FOB expressed good support in the area.  MOB reports this is her third baby and has a 6 yo boy and 4 yo girl who is currenlty staying with MGM.  MOB reports no concerns with supplies at this time, however CSW instructed MOB and FOB to let CSW know if any assistance was needed.  CSW discussed insurance and financial support.  MOB confirmed medicaid and reported additional support of WIC and food stamps. No hx of SA issues in chart. CSW will continue to offer support while infant in NICU.      VI SOCIAL WORK PLAN Social Work Plan  Psychosocial Support/Ongoing Assessment of Needs   Type of pt/family education:   PPD symptoms   If child protective services report - county:   If child protective services report - date:   Information/referral to community resources comment:   Other social work plan:    

## 2012-09-20 MED ORDER — OXYCODONE-ACETAMINOPHEN 5-325 MG PO TABS: 1.0000 | ORAL_TABLET | ORAL | Status: DC | PRN

## 2012-09-20 MED ORDER — IBUPROFEN 600 MG PO TABS: 600.0000 mg | ORAL_TABLET | Freq: Four times a day (QID) | ORAL | Status: DC | PRN

## 2012-09-20 MED ORDER — HYDROCORTISONE 1 % EX CREA
TOPICAL_CREAM | CUTANEOUS | Status: DC | PRN
  Filled 2012-09-20: qty 28

## 2012-09-20 MED ORDER — HYDROCORTISONE 1 % EX CREA: TOPICAL_CREAM | CUTANEOUS | Status: DC | PRN

## 2012-09-20 NOTE — Progress Notes (Signed)
UR chart review completed.  

## 2012-09-20 NOTE — Progress Notes (Signed)
Post Partum Day 3 Subjective: no complaints  Objective: Blood pressure 100/57, pulse 76, temperature 98.4 F (36.9 C), temperature source Oral, resp. rate 18, height 5\' 2"  (1.575 m), weight 172 lb (78.019 kg), last menstrual period 10/20/2011, SpO2 100.00%, unknown if currently breastfeeding.  Physical Exam:  General: alert and no distress Lochia: appropriate Uterine Fundus: firm Incision: healing well DVT Evaluation: No evidence of DVT seen on physical exam.   Recent Labs  09/18/12 0650 09/19/12 0730  HGB 9.2* 9.8*  HCT 28.7* 30.0*    Assessment/Plan: Discharge home   LOS: 4 days   HARPER,CHARLES A 09/20/2012, 8:43 AM

## 2012-09-23 NOTE — Discharge Summary (Signed)
Obstetric Discharge Summary Reason for Admission: induction of labor Prenatal Procedures: NST and ultrasound Intrapartum Procedures: spontaneous vaginal delivery Postpartum Procedures: none Complications-Operative and Postpartum: none Hemoglobin  Date Value Range Status  09/19/2012 9.8* 12.0 - 15.0 g/dL Final     HCT  Date Value Range Status  09/19/2012 30.0* 36.0 - 46.0 % Final    Physical Exam:  General: alert and no distress Lochia: appropriate Uterine Fundus: firm Incision: healing well DVT Evaluation: No evidence of DVT seen on physical exam.  Discharge Diagnoses: Term Pregnancy-delivered  Discharge Information: Date: 09/23/2012 Activity: pelvic rest Diet: routine Medications: PNV, Ibuprofen, Colace and Percocet Condition: stable Instructions: refer to practice specific booklet Discharge to: home Follow-up Information   Follow up with MARSHALL,BERNARD A, MD. Schedule an appointment as soon as possible for a visit in 6 weeks.   Contact information:   [address] [place] [address] [phone]       Newborn Data: Live born female  Birth Weight: 8 lb 3 oz (3714 g) APGAR: 8, 9  Home with mother.  Kyl Givler A 09/23/2012, 6:06 AM

## 2012-11-16 ENCOUNTER — Ambulatory Visit: Payer: Self-pay

## 2012-12-16 ENCOUNTER — Emergency Department (HOSPITAL_COMMUNITY)
Admission: EM | Admit: 2012-12-16 | Discharge: 2012-12-17 | Disposition: A | Discharge status: Home / Self Care | Payer: Self-pay | Attending: Emergency Medicine | Admitting: Emergency Medicine

## 2012-12-16 ENCOUNTER — Encounter (HOSPITAL_COMMUNITY): Payer: Self-pay | Admitting: Emergency Medicine

## 2012-12-16 DIAGNOSIS — L259 Unspecified contact dermatitis, unspecified cause: Secondary | ICD-10-CM | POA: Insufficient documentation

## 2012-12-16 DIAGNOSIS — IMO0002 Reserved for concepts with insufficient information to code with codable children: Secondary | ICD-10-CM | POA: Insufficient documentation

## 2012-12-16 DIAGNOSIS — Z79899 Other long term (current) drug therapy: Secondary | ICD-10-CM | POA: Insufficient documentation

## 2012-12-16 DIAGNOSIS — J45909 Unspecified asthma, uncomplicated: Secondary | ICD-10-CM | POA: Insufficient documentation

## 2012-12-16 DIAGNOSIS — Z8719 Personal history of other diseases of the digestive system: Secondary | ICD-10-CM | POA: Insufficient documentation

## 2012-12-16 MED ORDER — DIPHENHYDRAMINE HCL 25 MG PO CAPS
25.0000 mg | ORAL_CAPSULE | Freq: Once | ORAL | Status: AC
  Administered 2012-12-16: 25 mg via ORAL
  Filled 2012-12-16: qty 1

## 2012-12-16 MED ORDER — DIPHENHYDRAMINE HCL 25 MG PO TABS: 25.0000 mg | ORAL_TABLET | Freq: Four times a day (QID) | ORAL | Status: DC

## 2012-12-16 MED ORDER — PREDNISONE 20 MG PO TABS
60.0000 mg | ORAL_TABLET | Freq: Once | ORAL | Status: AC
  Administered 2012-12-16: 60 mg via ORAL
  Filled 2012-12-16: qty 3

## 2012-12-16 MED ORDER — PREDNISONE 20 MG PO TABS: ORAL_TABLET | ORAL | Status: DC

## 2012-12-16 NOTE — ED Notes (Signed)
PT. REPORTS ITCHY RASHES AT LEFT UPPER ARM AFTER INSERTION OF BIRTH CONTROL IMPLANT AT LEFT UPPER ARM 2 DAYS AGO AT HEALTH DEPT. AIRWAY INTACT / RESPIRATIONS UNLABORED . NO FEVER OR CHILLS.

## 2012-12-16 NOTE — Discharge Instructions (Signed)
Please read and follow all provided instructions.  Your diagnoses today include:  1. Contact dermatitis     Tests performed today include:  Vital signs. See below for your results today.   Medications prescribed:   Prednisone - steroid medicine   It is best to take this medication in the morning to prevent sleeping problems. If you are diabetic, monitor your blood sugar closely and stop taking Prednisone if blood sugar is over 300. Take with food to prevent stomach upset.    Benadryl (diphenhydramine) - antihistamine  You can find this medication over-the-counter.   DO NOT exceed:   50mg  Benadryl every 6 hours    Benadryl will make you drowsy. DO NOT drive or perform any activities that require you to be awake and alert if taking this.  Take any prescribed medications only as directed.  Home care instructions:   Follow any educational materials contained in this packet  Follow-up instructions: Please follow-up with your primary care provider in the next 3 days for further evaluation of your symptoms. If you do not have a primary care doctor -- see below for referral information.   Return instructions:   Please return to the Emergency Department if you experience worsening symptoms.   Call 9-1-1 immediately if you have an allergic reaction that involves your lips, mouth, throat or if you have any difficulty breathing. This is a life-threatening emergency.   Please return if you have any other emergent concerns.  Additional Information:  Your vital signs today were: BP 110/66   Pulse 87   Temp(Src) 98.3 F (36.8 C) (Oral)   Resp 18   SpO2 98% If your blood pressure (BP) was elevated above 135/85 this visit, please have this repeated by your doctor within one month. -------------- RESOURCE GUIDE  Chronic Pain Problems:  [facility]:  [phone]  Patients need to be referred by their primary care doctor  Insufficient Money for  Medicine:  [employer] Way:     call "211"  [facility]:  [phone]  No Primary Care Doctor:  To locate a primary care doctor that accepts your insurance or provides certain services:  [facility] :   [phone]  Physician Referral Service:  [phone]  Agencies that provide inexpensive medical care:  [facility]:   [phone]  [facility]:   [phone]  [facility]:   [phone]  Women's Clinic:     [phone]  Planned Parenthood:    [phone]  [facility]:     [phone]  Medicaid-accepting [facility] Providers:  [facility]  [address], [phone], Mon-Fri 9am-7pm, Sat 9am-1pm  [facility] [place] [facility]  [address] [place], [address] [place], [phone]  [facility]  [address], Suite [place], [phone]  [facility]  [address] [phone]  Renaye Rakers  [address], [phone]  Only accepts [place] [employer] patients after they have their name  applied to their card  Self Pay (no insurance) in Outpatient Plastic Surgery Center:  Sickle Cell Patients: Dr. Willey Blade, [facility]  [address] [place], [phone]  [facility]  [address] [place], [phone]  [facility] [place]  [address] [facility] [address], [facility] - [address]  [phone], Mon-Fri 9am-7pm, Sat 9am-1pm  [facility]  [address] [phone]  [facility]  [address] [phone]  [facility]  [address] [phone]  Dr. Julio Sicks  [address], [place], [phone]  [facility]  [address] [phone]  [facility]  [address] [phone]  [address], [phone]  [facility]  [address] [place], [phone], 1st  & 3rd Saturday every month, 10am-1pm  Strategies for finding a Primary Care Provider:  1) Find a Doctor and Pay Out of Pocket Although you won't have to find out who is covered by your insurance plan, it is a good idea to ask around and get recommendations. You will then need to call the office and see if the doctor you have chosen will accept you as a new patient and what types of options they offer for patients who are self-pay. Some doctors offer discounts or will set up payment plans for their patients who do not have insurance, but you will need to ask so you aren't surprised when you get to your appointment.  2) Contact Your Local Health Department Not all health departments have doctors that can see patients for sick visits, but many do, so it is worth a call to see if yours does. If you don't know where your local health department is, you can check in your phone book. The CDC also has a tool to help you locate your state's health department, and many state websites also have listings of all of their local health departments.  3) Find a Walk-in Clinic If your illness is not likely to be very severe or complicated, you may want to try a walk in clinic. These are popping up all over the country in pharmacies, drugstores, and shopping centers. They're usually staffed by nurse practitioners or physician assistants that have been trained to treat common illnesses and complaints. They're usually fairly quick and inexpensive. However, if you have serious medical issues or chronic medical problems, these are probably not your best option  STD Testing:  [facility] [place], [place] [facility]  [address], [place], phone [phone] or [phone]    Monday - Friday, call for an appointment  [facility] Department of [facility], [place] [facility]  [address], [place], phone [phone] or [phone]   Monday - Friday, call for an  appointment  Abuse/Neglect:  [facility] Child Abuse Hotline:  (212) 888-3760  [facility] Child Abuse Hotline: (586) 223-2140 (After Hours)  Emergency Shelter:  [facility] 440-772-9233  Maternity Homes:  [facility] [place] [facility]: [phone]  [facility]: [phone])[phone]  MRSA Hotline:   6137423462   [facility]  [address]  [phone]   [place]  [address], [place]  [phone]   [facility]  [address], [place]  [phone]   [facility]  [phone]   [facility]  [phone]   [facility] [place]  [address]  [phone], [facility] Child Abuse Hotline  206-035-6652  623-272-1338 (After Hours)   Behavioral Health Services  Substance Abuse Resources:  Alcohol and Drug Services:     [phone] [phone]  [facility]:   [phone]  [facility]:  [phone]  [facility]:      [phone]  Residential & Outpatient Substance Abuse Program: 6130216212  Psychological Services:  [facility]:   [phone]  [facility] Services:    [phone]  [facility] Mental Health  [address] [place]  ACCESS LINE: [phone] or [phone]  [employer]  Dental Assistance: If unable to pay or uninsured, contact:  [facility] or [facility]. to become qualified for the adult dental clinic.  Patients with Medicaid  [facility]  [phone] [address], [phone]  [address] [phone]  If unable to pay, or uninsured: contact [facility] [phone]) or [facility] Department [phone] in [place], [phone] in [facility]) to become qualified for the  adult dental clinic  Other Low-Cost Community Dental Services:  [facility]  [address] [place], [place], [place], [address]  [phone], Ext. [phone]  2nd and 4th Thursday of the month at [facility]  [facility]  [address] [place], [place], [place], [address]  [phone]  [facility]  [address] [place], [place], [address]  [phone]  [facility] Health Department  [phone]  [facility] Health Department  [phone]  [facility] Health Department  [phone]

## 2012-12-17 NOTE — ED Provider Notes (Signed)
CSN: [id number]     Arrival date & time 12/16/12  2322 History     First MD Initiated Contact with Patient 12/16/12 2340     Chief Complaint  Patient presents with  . Rash   (Consider location/radiation/quality/duration/timing/severity/associated sxs/prior Treatment) HPI Comments: Patient presents with complaint of left upper extremity rash that began after insertion of Implanon birth control. There was tape over this area to secure bandage in place. Patient has had Implanon in past and had no reaction at that time. She has had similar reaction to tape in the past. No treatments prior to arrival. Rash is itchy. No airway symptoms or difficulty breathing. No mouth swelling. No other new exposures. Onset of symptoms acute. Course is constant. Nothing makes symptoms better or worse.  Patient is a 22 y.o. female presenting with rash.  Rash Associated symptoms: no fever, no myalgias, no nausea, no shortness of breath, not vomiting and not wheezing     Past Medical History  Diagnosis Date  . Asthma   . Seasonal allergies   . Gall stones    Past Surgical History  Procedure Laterality Date  . Cholecystectomy    . Cesarean section    . Cholecystectomy     No family history on file. History  Substance Use Topics  . Smoking status: Never Smoker   . Smokeless tobacco: Never Used  . Alcohol Use: No   OB History   Grav Para Term Preterm Abortions TAB SAB Ect Mult Living   3 3 2 1      3      Review of Systems  Constitutional: Negative for fever.  HENT: Negative for facial swelling and trouble swallowing.   Eyes: Negative for redness.  Respiratory: Negative for shortness of breath, wheezing and stridor.   Cardiovascular: Negative for chest pain.  Gastrointestinal: Negative for nausea and vomiting.  Musculoskeletal: Negative for myalgias.  Skin: Positive for color change and rash.  Neurological: Negative for light-headedness.  Psychiatric/Behavioral: Negative for confusion.     Allergies  Avocado and Strawberry  Home Medications   Current Outpatient Rx  Name  Route  Sig  Dispense  Refill  . albuterol (PROVENTIL HFA;VENTOLIN HFA) 108 (90 BASE) MCG/ACT inhaler   Inhalation   Inhale 2 puffs into the lungs every 6 (six) hours as needed for wheezing or shortness of breath.         . cyclobenzaprine (FLEXERIL) 10 MG tablet   Oral   Take 1 tablet (10 mg total) by mouth 2 (two) times daily as needed for muscle spasms.   20 tablet   0   . diphenhydrAMINE (BENADRYL) 25 MG tablet   Oral   Take 1 tablet (25 mg total) by mouth every 6 (six) hours.   10 tablet   0   . hydrocortisone cream 1 %   Topical   Apply topically as needed.   30 g   0   . ibuprofen (ADVIL,MOTRIN) 600 MG tablet   Oral   Take 1 tablet (600 mg total) by mouth every 6 (six) hours as needed for pain (pain scale < 4).   30 tablet   5   . loratadine-pseudoephedrine (CLARITIN-D 12-HOUR) 5-120 MG per tablet   Oral   Take 1 tablet by mouth 2 (two) times daily.   30 tablet   0   . oxyCODONE-acetaminophen (PERCOCET/ROXICET) 5-325 MG per tablet   Oral   Take 1-2 tablets by mouth every 4 (four) hours as needed for pain.  40 tablet   0   . predniSONE (DELTASONE) 20 MG tablet      3 Tabs PO Days 1-3, then 2 tabs PO Days 4-6, then 1 tab PO Day 7-9, then Half Tab PO Day 10-12   20 tablet   0   . Prenatal Vit-Fe Fumarate-FA (PRENATAL MULTIVITAMIN) TABS   Oral   Take 1 tablet by mouth daily.          BP 110/66  Pulse 87  Temp(Src) 98.3 F (36.8 C) (Oral)  Resp 18  SpO2 98% Physical Exam  Nursing note and vitals reviewed. Constitutional: She appears well-developed and well-nourished.  HENT:  Head: Normocephalic and atraumatic.  No angioedema  Eyes: Conjunctivae are normal.  Neck: Normal range of motion. Neck supple.  Pulmonary/Chest: No respiratory distress. She has no wheezes.  Neurological: She is alert.  Skin: Skin is warm and dry.  Patient with localized  circular erythematous pruritic maculopapular reaction centered around insertion point of Implanon. Appears allergic. No drainage or discharge. No tenderness. No lymphangitis.  Psychiatric: She has a normal mood and affect.    ED Course   Procedures (including critical care time)  Labs Reviewed - No data to display No results found. 1. Contact dermatitis    Patient seen and examined. Medications ordered.   Vital signs reviewed and are as follows: Filed Vitals:   12/16/12 2328  BP: 110/66  Pulse: 87  Temp: 98.3 F (36.8 C)  Resp: 18   Urged f/u with PCP if sx persist. Counseled on alergic rxn, s/s to call 911.   MDM  Patient with localized allergic reaction. Given h/o previous implanon without this reaction and history of reaction to tape -- fell tape is likely trigger. Will give oral prednisone and benadryl -- safe for lactation. No airway symptoms.   Renne Crigler, PA-C 12/17/12 [phone]

## 2012-12-17 NOTE — ED Notes (Signed)
Pt comfortable with d/c and f/u instructions. Prescriptions x2. 

## 2012-12-18 NOTE — ED Provider Notes (Signed)
Medical screening examination/treatment/procedure(s) were performed by non-physician practitioner and as supervising physician I was immediately available for consultation/collaboration.   Gavin Pound. Kalyn Dimattia, MD 12/18/12 0140

## 2013-09-14 DIAGNOSIS — R6889 Other general symptoms and signs: Secondary | ICD-10-CM | POA: Insufficient documentation

## 2013-09-14 DIAGNOSIS — H9209 Otalgia, unspecified ear: Secondary | ICD-10-CM | POA: Insufficient documentation

## 2013-09-14 DIAGNOSIS — J029 Acute pharyngitis, unspecified: Secondary | ICD-10-CM | POA: Insufficient documentation

## 2013-09-14 DIAGNOSIS — J45909 Unspecified asthma, uncomplicated: Secondary | ICD-10-CM | POA: Insufficient documentation

## 2013-09-15 ENCOUNTER — Encounter (HOSPITAL_COMMUNITY): Payer: Self-pay | Admitting: Emergency Medicine

## 2013-09-15 ENCOUNTER — Emergency Department (HOSPITAL_COMMUNITY)
Admission: EM | Admit: 2013-09-15 | Discharge: 2013-09-15 | Discharge status: Against Medical Advice | Payer: No Typology Code available for payment source | Attending: Emergency Medicine | Admitting: Emergency Medicine

## 2013-09-15 NOTE — ED Notes (Signed)
Patient presents stating that her allergies are bothering her.  +runny nose, ear pain, sore throat.

## 2013-09-15 NOTE — ED Notes (Signed)
Pt reports she cannot stay any longer and needs to leave, pt understands risks of leaving. Charge RN aware.

## 2013-11-12 ENCOUNTER — Encounter (HOSPITAL_COMMUNITY): Payer: Self-pay | Admitting: Emergency Medicine

## 2013-11-12 ENCOUNTER — Emergency Department (HOSPITAL_COMMUNITY)
Admission: EM | Admit: 2013-11-12 | Discharge: 2013-11-13 | Disposition: A | Discharge status: Home / Self Care | Payer: Self-pay | Attending: Emergency Medicine | Admitting: Emergency Medicine

## 2013-11-12 DIAGNOSIS — Z9089 Acquired absence of other organs: Secondary | ICD-10-CM | POA: Insufficient documentation

## 2013-11-12 DIAGNOSIS — R109 Unspecified abdominal pain: Secondary | ICD-10-CM

## 2013-11-12 DIAGNOSIS — R509 Fever, unspecified: Secondary | ICD-10-CM | POA: Insufficient documentation

## 2013-11-12 DIAGNOSIS — R000 Tachycardia, unspecified: Secondary | ICD-10-CM | POA: Insufficient documentation

## 2013-11-12 DIAGNOSIS — Z79899 Other long term (current) drug therapy: Secondary | ICD-10-CM | POA: Insufficient documentation

## 2013-11-12 DIAGNOSIS — Z8719 Personal history of other diseases of the digestive system: Secondary | ICD-10-CM | POA: Insufficient documentation

## 2013-11-12 DIAGNOSIS — R112 Nausea with vomiting, unspecified: Secondary | ICD-10-CM

## 2013-11-12 DIAGNOSIS — H6692 Otitis media, unspecified, left ear: Secondary | ICD-10-CM

## 2013-11-12 DIAGNOSIS — J45909 Unspecified asthma, uncomplicated: Secondary | ICD-10-CM | POA: Insufficient documentation

## 2013-11-12 DIAGNOSIS — R1084 Generalized abdominal pain: Secondary | ICD-10-CM

## 2013-11-12 DIAGNOSIS — Z9889 Other specified postprocedural states: Secondary | ICD-10-CM | POA: Insufficient documentation

## 2013-11-12 DIAGNOSIS — H6690 Otitis media, unspecified, unspecified ear: Secondary | ICD-10-CM | POA: Insufficient documentation

## 2013-11-12 DIAGNOSIS — H6592 Unspecified nonsuppurative otitis media, left ear: Secondary | ICD-10-CM

## 2013-11-12 DIAGNOSIS — Z3202 Encounter for pregnancy test, result negative: Secondary | ICD-10-CM | POA: Insufficient documentation

## 2013-11-12 LAB — BASIC METABOLIC PANEL
ANION GAP: 19 — AB (ref 5–15)
BUN: 10 mg/dL (ref 6–23)
CO2: 20 meq/L (ref 19–32)
Calcium: 9.1 mg/dL (ref 8.4–10.5)
Chloride: 98 mEq/L (ref 96–112)
Creatinine, Ser: 0.51 mg/dL (ref 0.50–1.10)
GFR calc Af Amer: >90 mL/min (ref >90)
GLUCOSE: 91 mg/dL (ref 70–99)
POTASSIUM: 3.7 meq/L (ref 3.7–5.3)
SODIUM: 137 meq/L (ref 137–147)

## 2013-11-12 LAB — HEPATIC FUNCTION PANEL
ALBUMIN: 3.8 g/dL (ref 3.5–5.2)
ALK PHOS: 114 U/L (ref 39–117)
ALT: 42 U/L — ABNORMAL HIGH (ref 0–35)
AST: 22 U/L (ref 0–37)
BILIRUBIN TOTAL: 0.5 mg/dL (ref 0.3–1.2)
Total Protein: 7.5 g/dL (ref 6.0–8.3)

## 2013-11-12 LAB — CBC WITH DIFFERENTIAL/PLATELET
BASOS PCT: 0 % (ref 0–1)
Basophils Absolute: 0 10*3/uL (ref 0.0–0.1)
Eosinophils Absolute: 0.1 10*3/uL (ref 0.0–0.7)
Eosinophils Relative: 1 % (ref 0–5)
HEMATOCRIT: 39.9 % (ref 36.0–46.0)
HEMOGLOBIN: 14.2 g/dL (ref 12.0–15.0)
LYMPHS PCT: 20 % (ref 12–46)
Lymphs Abs: 2.6 10*3/uL (ref 0.7–4.0)
MCH: 30.7 pg (ref 26.0–34.0)
MCHC: 35.6 g/dL (ref 30.0–36.0)
MCV: 86.2 fL (ref 78.0–100.0)
MONOS PCT: 7 % (ref 3–12)
Monocytes Absolute: 0.9 10*3/uL (ref 0.1–1.0)
NEUTROS ABS: 9.6 10*3/uL — AB (ref 1.7–7.7)
NEUTROS PCT: 72 % (ref 43–77)
Platelets: 239 10*3/uL (ref 150–400)
RBC: 4.63 MIL/uL (ref 3.87–5.11)
RDW: 12.3 % (ref 11.5–15.5)
WBC: 13.2 10*3/uL — AB (ref 4.0–10.5)

## 2013-11-12 LAB — URINALYSIS, ROUTINE W REFLEX MICROSCOPIC
BILIRUBIN URINE: NEGATIVE
Glucose, UA: NEGATIVE mg/dL
KETONES UR: 15 mg/dL — AB
LEUKOCYTES UA: NEGATIVE
NITRITE: NEGATIVE
PH: 5.5 (ref 5.0–8.0)
Protein, ur: NEGATIVE mg/dL
SPECIFIC GRAVITY, URINE: 1.028 (ref 1.005–1.030)
UROBILINOGEN UA: 0.2 mg/dL (ref 0.0–1.0)

## 2013-11-12 LAB — LIPASE, BLOOD: Lipase: 16 U/L (ref 11–59)

## 2013-11-12 LAB — URINE MICROSCOPIC-ADD ON

## 2013-11-12 LAB — PREGNANCY, URINE: PREG TEST UR: NEGATIVE

## 2013-11-12 MED ORDER — SODIUM CHLORIDE 0.9 % IV BOLUS (SEPSIS)
1000.0000 mL | Freq: Once | INTRAVENOUS | Status: AC
  Administered 2013-11-12: 1000 mL via INTRAVENOUS

## 2013-11-12 MED ORDER — ONDANSETRON HCL 4 MG/2ML IJ SOLN
4.0000 mg | Freq: Once | INTRAMUSCULAR | Status: AC
  Administered 2013-11-12: 4 mg via INTRAVENOUS
  Filled 2013-11-12: qty 2

## 2013-11-12 MED ORDER — ACETAMINOPHEN 500 MG PO TABS
1000.0000 mg | ORAL_TABLET | Freq: Once | ORAL | Status: AC
  Administered 2013-11-12: 1000 mg via ORAL
  Filled 2013-11-12: qty 2

## 2013-11-12 MED ORDER — ONDANSETRON HCL 4 MG/2ML IJ SOLN
4.0000 mg | Freq: Once | INTRAMUSCULAR | Status: DC
  Filled 2013-11-12: qty 2

## 2013-11-12 MED ORDER — MORPHINE SULFATE 4 MG/ML IJ SOLN
4.0000 mg | Freq: Once | INTRAMUSCULAR | Status: AC
  Administered 2013-11-12: 4 mg via INTRAVENOUS
  Filled 2013-11-12: qty 1

## 2013-11-12 NOTE — ED Provider Notes (Signed)
CSN: [id number]     Arrival date & time 11/12/13  2040 History  This chart was scribed for non-physician practitioner working with Hurman HornJohn M Bednar, MD by Elveria Risingimelie Horne, ED Scribe. This patient was seen in room TR05C/TR05C and the patient's care was started at 10:21 PM.   Chief Complaint  Patient presents with  . Otalgia     HPI Comments: [name] is a 23 y.o. female who presents to the Emergency Department complaining of left ear pain, ongoing for three days. Patient reports associated congestion, cough, rhinorrhea, headache onset one week ago.Treatment with hydrocodone, without relief. Last taken this morning at 11am. Patient reports 10 episodes of vomiting today. States that she is unable to tolerated solids or liquids all day. Patient reports abdominal pain prior to her emesis. Patient denies hematuria or other urinary symptoms. Patient does not have period. Has implant.  NO PCP.  The history is provided by the patient. No language interpreter was used.    Past Medical History  Diagnosis Date  . Asthma   . Seasonal allergies   . Gall stones    Past Surgical History  Procedure Laterality Date  . Cholecystectomy    . Cesarean section    . Cholecystectomy     No family history on file. History  Substance Use Topics  . Smoking status: Never Smoker   . Smokeless tobacco: Never Used  . Alcohol Use: No   OB History   Grav Para Term Preterm Abortions TAB SAB Ect Mult Living   3 3 2 1      3      Review of Systems  Constitutional: Negative for fever and chills.  HENT: Positive for congestion, ear pain and rhinorrhea. Negative for ear discharge.   Respiratory: Negative for cough.   Cardiovascular: Negative for chest pain.  Gastrointestinal: Positive for nausea, vomiting and abdominal pain.  Genitourinary: Negative for dysuria, urgency, frequency and hematuria.  Neurological: Positive for headaches.      Allergies  Avocado; Norco; and Strawberry  Home  Medications   Prior to Admission medications   Medication Sig Start Date End Date Taking? Authorizing Provider  albuterol (PROVENTIL HFA;VENTOLIN HFA) 108 (90 BASE) MCG/ACT inhaler Inhale 2 puffs into the lungs every 6 (six) hours as needed for wheezing or shortness of breath. 03/24/12  Yes Sanjuana KavaAgnes I Nwoko, NP  HYDROcodone-acetaminophen (NORCO/VICODIN) 5-325 MG per tablet Take 2 tablets by mouth every 6 (six) hours as needed for moderate pain.   Yes Historical Provider, MD   Triage Vitals: BP 113/74  Pulse 118  Temp(Src) 98.5 F (36.9 C) (Oral)  Resp 20  Ht 5\' 2"  (1.575 m)  Wt 160 lb (72.576 kg)  BMI 29.26 kg/m2  SpO2 100%  Breastfeeding? Yes Physical Exam  Nursing note and vitals reviewed. Constitutional: She is oriented to person, place, and time. She appears well-developed and well-nourished.  Non-toxic appearance. She does not have a sickly appearance. She does not appear ill. No distress.  HENT:  Head: Normocephalic and atraumatic.  Right Ear: Tympanic membrane normal. Tympanic membrane is not erythematous and not bulging. No middle ear effusion.  Left Ear: Tympanic membrane is erythematous and bulging. Tympanic membrane is not retracted. A middle ear effusion is present.  Nose: Rhinorrhea present.  Mouth/Throat: Uvula is midline and oropharynx is clear and moist. No oropharyngeal exudate.  Eyes: EOM are normal. Pupils are equal, round, and reactive to light.  Neck: Neck supple.  Cardiovascular: Regular rhythm.  Tachycardia present.   Pulmonary/Chest: Effort  normal. No respiratory distress. She has no decreased breath sounds. She has no wheezes. She has no rhonchi. She has no rales.  Abdominal: Soft. There is generalized tenderness. There is no rigidity and no guarding.  Mild generalized abdominal discomfort, moderate suprapubic discomfort.  Musculoskeletal: Normal range of motion.  Neurological: She is alert and oriented to person, place, and time.  Skin: Skin is warm and dry.  She is not diaphoretic.  Psychiatric: She has a normal mood and affect. Her behavior is normal.    ED Course  Procedures (including critical care time) COORDINATION OF CARE: 10:28 PM- Discussed treatment plan with patient at bedside and patient agreed to plan.   Labs Review Labs Reviewed - No data to display  Imaging Review No results found.   EKG Interpretation None      MDM   Final diagnoses:  Otitis media with effusion, left  Abdominal pain, unspecified abdominal location  Nausea and vomiting, vomiting of unspecified type   Patient presents with left otitis media. During the exam the patient also complains of abdominal pain and approximately 10 episodes of vomiting today. Did not feel like this patient was appropriate for fast track and had the patient moved to the acute side. Labs, UA, fluids, zofran ordered. Discussed with Dr. Fonnie Jarvis.  Meds given in ED:  Medications  ondansetron (ZOFRAN) injection 4 mg (not administered)  sodium chloride 0.9 % bolus 1,000 mL (not administered)    New Prescriptions   No medications on file    I personally performed the services described in this documentation, which was scribed in my presence. The recorded information has been reviewed and is accurate.    Clabe Seal, PA-C 11/13/13 0210

## 2013-11-12 NOTE — ED Provider Notes (Signed)
CSN: [id number]     Arrival date & time 11/12/13  2040 History   First MD Initiated Contact with Patient 11/12/13 2059     Chief Complaint  Patient presents with  . Otalgia     (Consider location/radiation/quality/duration/timing/severity/associated sxs/prior Treatment) Patient is a 23 y.o. female presenting with ear pain.  Otalgia Location:  Left Quality:  Pressure Severity:  Severe Onset quality:  Gradual Duration:  3 days Timing:  Constant Progression:  Worsening Chronicity:  New Context comment:  1 week of nasal congestion, sinus pressure Relieved by:  Nothing Worsened by:  Nothing tried Associated symptoms: abdominal pain, congestion, fever (+ sweats and chills), sore throat and vomiting     Past Medical History  Diagnosis Date  . Asthma   . Seasonal allergies   . Gall stones    Past Surgical History  Procedure Laterality Date  . Cholecystectomy    . Cesarean section    . Cholecystectomy     No family history on file. History  Substance Use Topics  . Smoking status: Never Smoker   . Smokeless tobacco: Never Used  . Alcohol Use: No   OB History   Grav Para Term Preterm Abortions TAB SAB Ect Mult Living   3 3 2 1      3      Review of Systems  Constitutional: Positive for fever (+ sweats and chills).  HENT: Positive for congestion, ear pain and sore throat.   Gastrointestinal: Positive for vomiting and abdominal pain.  All other systems reviewed and are negative.     Allergies  Avocado; Norco; and Strawberry  Home Medications   Prior to Admission medications   Medication Sig Start Date End Date Taking? Authorizing Provider  albuterol (PROVENTIL HFA;VENTOLIN HFA) 108 (90 BASE) MCG/ACT inhaler Inhale 2 puffs into the lungs every 6 (six) hours as needed for wheezing or shortness of breath. 03/24/12  Yes Sanjuana Kava, NP  HYDROcodone-acetaminophen (NORCO/VICODIN) 5-325 MG per tablet Take 2 tablets by mouth every 6 (six) hours as needed for moderate  pain.   Yes Historical Provider, MD   BP 104/72  Pulse 101  Temp(Src) 98.5 F (36.9 C) (Oral)  Resp 16  Ht 5\' 2"  (1.575 m)  Wt 160 lb (72.576 kg)  BMI 29.26 kg/m2  SpO2 100%  Breastfeeding? Yes Physical Exam  Nursing note and vitals reviewed. Constitutional: She is oriented to person, place, and time. She appears well-developed and well-nourished. No distress.  HENT:  Head: Normocephalic and atraumatic.  Right Ear: Tympanic membrane and ear canal normal. No middle ear effusion.  Left Ear: Ear canal normal. Tympanic membrane is bulging. A middle ear effusion is present.  Nose: Right sinus exhibits frontal sinus tenderness. Left sinus exhibits frontal sinus tenderness.  Mouth/Throat: Oropharynx is clear and moist.  Eyes: Conjunctivae are normal. Pupils are equal, round, and reactive to light. No scleral icterus.  Neck: Neck supple.  Cardiovascular: Normal rate, regular rhythm, normal heart sounds and intact distal pulses.   No murmur heard. Pulmonary/Chest: Effort normal and breath sounds normal. No stridor. No respiratory distress. She has no rales.  Abdominal: Soft. Bowel sounds are normal. She exhibits no distension. There is generalized tenderness. There is CVA tenderness. There is no rigidity, no rebound and no guarding.  Musculoskeletal: Normal range of motion.  Neurological: She is alert and oriented to person, place, and time.  Skin: Skin is warm and dry. No rash noted.  Psychiatric: She has a normal mood and affect. Her behavior  is normal.    ED Course  Procedures (including critical care time) Labs Review Labs Reviewed  CBC WITH DIFFERENTIAL - Abnormal; Notable for the following:    WBC 13.2 (*)    All other components within normal limits  BASIC METABOLIC PANEL - Abnormal; Notable for the following:    Anion gap 19 (*)    All other components within normal limits  URINALYSIS, ROUTINE W REFLEX MICROSCOPIC - Abnormal; Notable for the following:    APPearance CLOUDY  (*)    Hgb urine dipstick SMALL (*)    Ketones, ur 15 (*)    All other components within normal limits  URINE MICROSCOPIC-ADD ON - Abnormal; Notable for the following:    Squamous Epithelial / LPF FEW (*)    Bacteria, UA MANY (*)    All other components within normal limits  PREGNANCY, URINE  LIPASE, BLOOD  HEPATIC FUNCTION PANEL    Imaging Review Ct Abdomen Pelvis W Contrast  11/13/2013   CLINICAL DATA:  Left upper abdominal pain, fever, nausea.  EXAM: CT ABDOMEN AND PELVIS WITH CONTRAST  TECHNIQUE: Multidetector CT imaging of the abdomen and pelvis was performed using the standard protocol following bolus administration of intravenous contrast.  CONTRAST:  100mL OMNIPAQUE IOHEXOL 300 MG/ML  SOLN  COMPARISON:  Prior ultrasound from 09/16/2012  FINDINGS: Subsegmental atelectasis seen dependently within the visualized lower lobes.  Diffuse hypoattenuation of the liver suggests mild steatosis. Gallbladder is surgically absent. No biliary dilatation. Spleen, adrenal glands, and pancreas demonstrate a normal contrast enhanced appearance.  Kidneys are equal in size with symmetric enhancement. No nephrolithiasis, hydronephrosis, or focal enhancing renal mass.  Stomach within normal limits. No evidence of bowel obstruction. Appendix is normal. Mild circumferential wall thickening seen about several nondilated loops of small bowel clustered in the left upper abdomen, nonspecific, but may represent underlying enteritis.  Tiny periumbilical fat containing hernia noted.  Bladder within normal limits. Uterus and ovaries are unremarkable for patient age.  No free air or fluid. No enlarged intra-abdominal pelvic lymph nodes. Normal intravascular enhancement seen throughout the abdomen and pelvis.  No acute osseous abnormality. No worrisome lytic or blastic osseous lesions.  IMPRESSION: 1. Mild circumferential wall thickening about several nondilated loops of small bowel clustered in the left upper quadrant. Finding  is nonspecific, and may be related to incomplete distension, although possible acute enteritis could also be considered in the correct clinical setting. 2. No other acute intra-abdominal pelvic process. 3. Hepatic steatosis. 4. Status post cholecystectomy.   Electronically Signed   By: Rise MuBenjamin  McClintock M.D.   On: 11/13/2013 03:54  All radiology studies independently viewed by me.      EKG Interpretation None      MDM   Final diagnoses:  Acute left otitis media, recurrence not specified, unspecified otitis media type  Generalized abdominal pain  Non-intractable vomiting with nausea, vomiting of unspecified type    23 yo female with 1 week of nasal congestion, 3 days of left ear pain, and abd pain and nausea/vomiting which started today.  On exam, slightly tachycardic, nontoxic appearance, left ear OM, abdomen soft but diffusely tender.  Plan to check labs, symptom control with IV morphine and zofran.  I suspect that driving process is her OM.  I have a low suspicion for intraabdominal process.  Will repeat abdominal exam and check labs.    Pt continued to have severe abdominal pain, so CT obtained.  It showed evidence of enteritis, but no other acute pathology.  Pain and nausea improved with IV meds.  DC'd with amoxicillin for OM.  Also gave percocet and zofran, but advised her not to breastfeed while taking these medications.    Candyce Churn III, MD 11/13/13 [phone]

## 2013-11-12 NOTE — ED Notes (Signed)
Patient presents stating that 3 days ago she started with left ear pain.  Patient has been congested.

## 2013-11-13 ENCOUNTER — Encounter (HOSPITAL_COMMUNITY): Payer: Self-pay

## 2013-11-13 ENCOUNTER — Emergency Department (HOSPITAL_COMMUNITY): Payer: No Typology Code available for payment source

## 2013-11-13 MED ORDER — IOHEXOL 300 MG/ML  SOLN
100.0000 mL | Freq: Once | INTRAMUSCULAR | Status: AC | PRN
  Administered 2013-11-13: 100 mL via INTRAVENOUS

## 2013-11-13 MED ORDER — AMOXICILLIN 875 MG PO TABS: 875.0000 mg | ORAL_TABLET | Freq: Two times a day (BID) | ORAL | Status: DC

## 2013-11-13 MED ORDER — MORPHINE SULFATE 4 MG/ML IJ SOLN
4.0000 mg | Freq: Once | INTRAMUSCULAR | Status: AC
  Administered 2013-11-13: 4 mg via INTRAVENOUS
  Filled 2013-11-13: qty 1

## 2013-11-13 MED ORDER — ONDANSETRON HCL 4 MG PO TABS: 4.0000 mg | ORAL_TABLET | Freq: Three times a day (TID) | ORAL | Status: DC | PRN

## 2013-11-13 MED ORDER — METOCLOPRAMIDE HCL 5 MG/ML IJ SOLN
10.0000 mg | Freq: Once | INTRAMUSCULAR | Status: AC
  Administered 2013-11-13: 10 mg via INTRAVENOUS
  Filled 2013-11-13: qty 2

## 2013-11-13 MED ORDER — OXYCODONE-ACETAMINOPHEN 5-325 MG PO TABS: 1.0000 | ORAL_TABLET | ORAL | Status: DC | PRN

## 2013-11-13 NOTE — ED Notes (Signed)
Contacted CT, pt completed oral contrast.

## 2013-11-13 NOTE — Discharge Instructions (Signed)
Dolor abdominal (Abdominal Pain) El dolor puede tener muchas causas. Normalmente la causa del dolor abdominal no es una enfermedad y Scientist, clinical (histocompatibility and immunogenetics)mejorar sin TEFL teachertratamiento. Frecuentemente puede controlarse y tratarse en casa. Su mdico le Medical sales representativerealizar un examen fsico y posiblemente solicite anlisis de sangre y radiografas para ayudar a Chief Strategy Officerdeterminar la gravedad de su dolor. Sin embargo, en IAC/InterActiveCorpmuchos casos, debe transcurrir ms tiempo antes de que se pueda Clinical research associateencontrar una causa evidente del dolor. Antes de llegar a ese punto, es posible que su mdico no sepa si necesita ms pruebas o un tratamiento ms profundo. INSTRUCCIONES PARA EL CUIDADO EN EL HOGAR  Est atento al dolor para ver si hay cambios. Las siguientes indicaciones ayudarn a Architectural technologistaliviar cualquier molestia que pueda sentir:  Gratonome solo medicamentos de venta libre o recetados, segn las indicaciones del mdico.  No tome laxantes a menos que se lo haya indicado su mdico.  Pruebe con Neomia Dearuna dieta lquida absoluta (caldo, t o agua) segn se lo indique su mdico. Introduzca gradualmente una dieta normal, segn su tolerancia. SOLICITE ATENCIN MDICA SI:  Tiene dolor abdominal sin explicacin.  Tiene dolor abdominal relacionado con nuseas o diarrea.  Tiene dolor cuando orina o defeca.  Experimenta dolor abdominal que lo despierta de noche.  Tiene dolor abdominal que empeora o mejora cuando come alimentos.  Tiene dolor abdominal que empeora cuando come alimentos grasosos.  Tiene fiebre. SOLICITE ATENCIN MDICA DE INMEDIATO SI:   El dolor no desaparece en un plazo mximo de 2horas.  No deja de (vomitar).  El Engineer, miningdolor se siente solo en partes del abdomen, como el lado derecho o la parte inferior izquierda del abdomen.  Evaca materia fecal sanguinolenta o negra, de aspecto alquitranado. ASEGRESE DE QUE:  Comprende estas instrucciones.  Controlar su afeccin.  Recibir ayuda de inmediato si no mejora o si empeora. Document Released: 04/14/2005  Document Revised: 04/19/2013 [facility] Patient Information 2015 [place], [place]. This information is not intended to replace advice given to you by your health care provider. Make sure you discuss any questions you have with your health care provider.  Otitis media (Otitis Media) La otitis media es el enrojecimiento, el dolor y la hinchazn (inflamacin) del odo medio. La causa de la otitis media puede ser Vella Raringuna alergia o, ms frecuentemente, una infeccin. Muchas veces ocurre como una complicacin de un resfro comn. SIGNOS Y SNTOMAS Los sntomas de la otitis media pueden incluir:  Dolor de odos.  Grant RutsFiebre.  Zumbidos en el odo.  Dolor de Turkmenistancabeza.  Prdida de lquido por el odo. DIAGNSTICO Para diagnosticar la otitis media, el mdico examinar su odo con un otoscopio. Este instrumento le permite al mdico observar el interior del odo y examinar el tmpano. El Barrister's clerkmdico le preguntar acerca de sus sntomas y le har un examen fsico. TRATAMIENTO  Generalmente la otitis media mejora sin tratamiento entre 3 y los [address] das. El mdico podr recetar medicamentos para Primary school teachercalmar el dolor. Si la otitis media no mejora en 5 das o es recurrente, el mdico puede recetar antibiticos si sospecha que la causa es una infeccin bacteriana. INSTRUCCIONES PARA EL CUIDADO EN EL HOGAR   Tome los medicamentos segn las indicaciones hasta finalizarlos, aunque despus de [address]unos das se sienta mejor.  Utilice los medicamentos de venta libre o recetados para Primary school teachercalmar el dolor, el malestar o la fiebre, segn se lo indique el mdico.  Concurra a las consultas de control con su mdico segn las indicaciones. SOLICITE ATENCIN MDICA SI:  Tiene otitis media en un solo odo o  sangra por la Darene Lamer, o ambas cosas.  Advierte un bulto en el cuello.  No mejora luego de 3 a 5 das.  Empeora en lugar de mejorar. SOLICITE ATENCIN MDICA DE INMEDIATO SI:   Aumenta el dolor y no puede controlarlo con Estate manager/land agent.  Observa hinchazn o enrojecimiento, o siente dolor alrededor del odo o rigidez en el cuello.  Nota que una parte de su rostro est paralizada.  Nota que el hueso que se encuentra detrs de la oreja (mastoides) le duele al tocarlo. ASEGRESE DE QUE:   Comprende estas instrucciones.  Controlar su afeccin.  Recibir ayuda de inmediato si no mejora o si empeora. Document Released: 01/22/2005 Document Revised: 04/19/2013 [facility] Patient Information 2015 [place], [place]. This information is not intended to replace advice given to you by your health care provider. Make sure you discuss any questions you have with your health care provider.

## 2013-11-13 NOTE — ED Provider Notes (Signed)
Medical screening examination/treatment/procedure(s) were conducted as a shared visit with non-physician practitioner(s) and myself.  I personally evaluated the patient during the encounter.   EKG Interpretation None      Medical screening exam performed by PA Parker in Fast Track and evaluated primarily by me in the Acute ED.  Please see my separate note.   Candyce ChurnJohn David Cassadi Purdie III, MD 11/13/13 [phone]

## 2014-02-27 ENCOUNTER — Encounter (HOSPITAL_COMMUNITY): Payer: Self-pay

## 2015-08-17 LAB — CYTOLOGY - PAP
PAP SMEAR: NEGATIVE
Pap: NEGATIVE

## 2016-07-10 ENCOUNTER — Inpatient Hospital Stay (HOSPITAL_COMMUNITY)
Admission: AD | Admit: 2016-07-10 | Discharge: 2016-07-11 | Disposition: A | Discharge status: Home / Self Care | Payer: No Typology Code available for payment source | Source: Ambulatory Visit | Attending: Obstetrics and Gynecology | Admitting: Obstetrics and Gynecology

## 2016-07-10 DIAGNOSIS — O26891 Other specified pregnancy related conditions, first trimester: Secondary | ICD-10-CM | POA: Insufficient documentation

## 2016-07-10 DIAGNOSIS — R102 Pelvic and perineal pain: Secondary | ICD-10-CM | POA: Insufficient documentation

## 2016-07-10 DIAGNOSIS — Z79899 Other long term (current) drug therapy: Secondary | ICD-10-CM | POA: Insufficient documentation

## 2016-07-10 DIAGNOSIS — Z888 Allergy status to other drugs, medicaments and biological substances status: Secondary | ICD-10-CM | POA: Insufficient documentation

## 2016-07-10 DIAGNOSIS — O34219 Maternal care for unspecified type scar from previous cesarean delivery: Secondary | ICD-10-CM | POA: Insufficient documentation

## 2016-07-10 DIAGNOSIS — Z9049 Acquired absence of other specified parts of digestive tract: Secondary | ICD-10-CM | POA: Insufficient documentation

## 2016-07-10 DIAGNOSIS — O3680X0 Pregnancy with inconclusive fetal viability, not applicable or unspecified: Secondary | ICD-10-CM

## 2016-07-11 ENCOUNTER — Inpatient Hospital Stay (HOSPITAL_COMMUNITY): Payer: Self-pay

## 2016-07-11 ENCOUNTER — Encounter (HOSPITAL_COMMUNITY): Payer: Self-pay | Admitting: *Deleted

## 2016-07-11 DIAGNOSIS — O9989 Other specified diseases and conditions complicating pregnancy, childbirth and the puerperium: Secondary | ICD-10-CM

## 2016-07-11 DIAGNOSIS — R102 Pelvic and perineal pain: Secondary | ICD-10-CM

## 2016-07-11 LAB — CBC
HEMATOCRIT: 37.4 % (ref 36.0–46.0)
HEMOGLOBIN: 13.5 g/dL (ref 12.0–15.0)
MCH: 31.1 pg (ref 26.0–34.0)
MCHC: 36.1 g/dL — ABNORMAL HIGH (ref 30.0–36.0)
MCV: 86.2 fL (ref 78.0–100.0)
PLATELETS: 228 10*3/uL (ref 150–400)
RBC: 4.34 MIL/uL (ref 3.87–5.11)
RDW: 12.4 % (ref 11.5–15.5)
WBC: 10.5 10*3/uL (ref 4.0–10.5)

## 2016-07-11 LAB — URINALYSIS, ROUTINE W REFLEX MICROSCOPIC
Bilirubin Urine: NEGATIVE
GLUCOSE, UA: NEGATIVE mg/dL
Hgb urine dipstick: NEGATIVE
KETONES UR: NEGATIVE mg/dL
Nitrite: NEGATIVE
Protein, ur: NEGATIVE mg/dL
SPECIFIC GRAVITY, URINE: 1.023 (ref 1.005–1.030)
pH: 6 (ref 5.0–8.0)

## 2016-07-11 LAB — HCG, QUANTITATIVE, PREGNANCY: hCG, Beta Chain, Quant, S: 591 m[IU]/mL — ABNORMAL HIGH (ref <5)

## 2016-07-11 LAB — POCT PREGNANCY, URINE: Preg Test, Ur: POSITIVE — AB

## 2016-07-11 LAB — WET PREP, GENITAL
CLUE CELLS WET PREP: NONE SEEN
Sperm: NONE SEEN
TRICH WET PREP: NONE SEEN
Yeast Wet Prep HPF POC: NONE SEEN

## 2016-07-11 NOTE — Discharge Instructions (Signed)
Ectopic Pregnancy °An ectopic pregnancy is when the fertilized egg attaches (implants) outside the uterus. Most ectopic pregnancies occur in one of the tubes where eggs travel from the ovary to the uterus (fallopian tubes), but the implanting can occur in other locations. In rare cases, ectopic pregnancies occur on the ovary, intestine, pelvis, abdomen, or cervix. In an ectopic pregnancy, the fertilized egg does not have the ability to develop into a normal, healthy baby. °A ruptured ectopic pregnancy is one in which tearing or bursting of a fallopian tube causes internal bleeding. Often, there is intense lower abdominal pain, and vaginal bleeding sometimes occurs. Having an ectopic pregnancy can be life-threatening. If this dangerous condition is not treated, it can lead to blood loss, shock, or even death. °What are the causes? °The most common cause of this condition is damage to one of the fallopian tubes. A fallopian tube may be narrowed or blocked, and that keeps the fertilized egg from reaching the uterus. °What increases the risk? °This condition is more likely to develop in women of childbearing age who have different levels of risk. The levels of risk can be divided into three categories. °High risk  °· You have gone through infertility treatment. °· You have had an ectopic pregnancy before. °· You have had surgery on the fallopian tubes, or another surgical procedure, such as an abortion. °· You have had surgery to have the fallopian tubes tied (tubal ligation). °· You have problems or diseases of the fallopian tubes. °· You have been exposed to diethylstilbestrol (DES). This medicine was used until 1971, and it had effects on babies whose mothers took the medicine. °· You become pregnant while using an IUD (intrauterine device) for birth control. °Moderate risk  °· You have a history of infertility. °· You have had an STI (sexually transmitted infection). °· You have a history of pelvic inflammatory  disease (PID). °· You have scarring from endometriosis. °· You have multiple sexual partners. °· You smoke. °Low risk  °· You have had pelvic surgery. °· You use vaginal douches. °· You became sexually active before age 18. °What are the signs or symptoms? °Common symptoms of this condition include normal pregnancy symptoms, such as missing a period, nausea, tiredness, abdominal pain, breast tenderness, and bleeding. However, ectopic pregnancy will have additional symptoms, such as: °· Pain with intercourse. °· Irregular vaginal bleeding or spotting. °· Cramping or pain on one side or in the lower abdomen. °· Fast heartbeat, low blood pressure, and sweating. °· Passing out while having a bowel movement. °Symptoms of a ruptured ectopic pregnancy and internal bleeding may include: °· Sudden, severe pain in the abdomen and pelvis. °· Dizziness, weakness, light-headedness, or fainting. °· Pain in the shoulder or neck area. °How is this diagnosed? °This condition is diagnosed by: °· A pelvic exam to locate pain or a mass in the abdomen. °· A pregnancy test. This blood test checks for the presence as well as the specific level of pregnancy hormone in the bloodstream. °· Ultrasound. This is performed if a pregnancy test is positive. In this test, a probe is inserted into the vagina. The probe will detect a fetus, possibly in a location other than the uterus. °· Taking a sample of uterus tissue (dilation and curettage, or D&C). °· Surgery to perform a visual exam of the inside of the abdomen using a thin, lighted tube that has a tiny camera on the end (laparoscope). °· Culdocentesis. This procedure involves inserting a needle at the   top of the vagina, behind the uterus. If blood is present in this area, it may indicate that a fallopian tube is torn. °How is this treated? °This condition is treated with medicine or surgery. °Medicine  °· An injection of a medicine (methotrexate) may be given to cause the pregnancy tissue to  be absorbed. This medicine may save your fallopian tube. It may be given if: °¨ The diagnosis is made early, with no signs of active bleeding. °¨ The fallopian tube has not ruptured. °¨ You are considered to be a good candidate for the medicine. °Usually, pregnancy hormone blood levels are checked after methotrexate treatment. This is to be sure that the medicine is effective. It may take 4-6 weeks for the pregnancy to be absorbed. Most pregnancies will be absorbed by 3 weeks. °Surgery  °· A laparoscope may be used to remove the pregnancy tissue. °· If severe internal bleeding occurs, a larger cut (incision) may be made in the lower abdomen (laparotomy) to remove the fetus and placenta. This is done to stop the bleeding. °· Part or all of the fallopian tube may be removed (salpingectomy) along with the fetus and placenta. The fallopian tube may also be repaired during the surgery. °· In very rare circumstances, removal of the uterus (hysterectomy) may be required. °· After surgery, pregnancy hormone testing may be done to be sure that there is no pregnancy tissue left. °Whether your treatment is medicine or surgery, you may receive a Rho (D) immune globulin shot to prevent problems with any future pregnancy. This shot may be given if: °· You are Rh-negative and the baby's father is Rh-positive. °· You are Rh-negative and you do not know the Rh type of the baby's father. °Follow these instructions at home: °· Rest and limit your activity after the procedure for as long as told by your health care provider. °· Until your health care provider says that it is safe: °¨ Do not lift anything that is heavier than 10 lb (4.5 kg), or the limit that your health care provider tells you. °¨ Avoid physical exercise and any movement that requires effort (is strenuous). °· To help prevent constipation: °¨ Eat a healthy diet that includes fruits, vegetables, and whole grains. °¨ Drink 6-8 glasses of water per day. °Get help right  away if: °· You develop worsening pain that is not relieved by medicine. °· You have: °¨ A fever or chills. °¨ Vaginal bleeding. °¨ Redness and swelling at the incision site. °¨ Nausea and vomiting. °· You feel dizzy or weak. °· You feel light-headed or you faint. °This information is not intended to replace advice given to you by your health care provider. Make sure you discuss any questions you have with your health care provider. °Document Released: 05/22/2004 Document Revised: 12/12/2015 Document Reviewed: 11/14/2015 °Elsevier Interactive Patient Education © 2017 [employer]vier Inc. ° °

## 2016-07-11 NOTE — MAU Provider Note (Signed)
History     CSN: [id number]  Arrival date and time: 07/10/16 2330   First Provider Initiated Contact with Patient 07/11/16 0129      Chief Complaint  Patient presents with  . Pelvic Pain   Pelvic Pain  The patient's primary symptoms include pelvic pain. The patient's pertinent negatives include no vaginal discharge. This is a new problem. The current episode started in the past 7 days. The problem occurs intermittently. The problem has been unchanged. Pain severity now: 6/10  The problem affects both sides. She is pregnant. Pertinent negatives include no dysuria, fever, frequency, nausea, urgency or vomiting. The vaginal discharge was normal. There has been no bleeding. Nothing aggravates the symptoms. Her menstrual history has been regular (LMP: 06/10/16 ).    Past Medical History:  Diagnosis Date  . Asthma   . Gall stones   . Seasonal allergies     Past Surgical History:  Procedure Laterality Date  . CESAREAN SECTION    . CHOLECYSTECTOMY    . CHOLECYSTECTOMY      History reviewed. No pertinent family history.  Social History  Substance Use Topics  . Smoking status: Never Smoker  . Smokeless tobacco: Never Used  . Alcohol use No    Allergies:  Allergies  Allergen Reactions  . Avocado Anaphylaxis  . Norco [Hydrocodone-Acetaminophen] Nausea Only    dizziness  . Strawberry Extract Hives, Shortness Of Breath and Swelling    Prescriptions Prior to Admission  Medication Sig Dispense Refill Last Dose  . albuterol (PROVENTIL HFA;VENTOLIN HFA) 108 (90 BASE) MCG/ACT inhaler Inhale 2 puffs into the lungs every 6 (six) hours as needed for wheezing or shortness of breath.   More than a month at Unknown time  . amoxicillin (AMOXIL) 875 MG tablet Take 1 tablet (875 mg total) by mouth 2 (two) times daily. 20 tablet 0   . HYDROcodone-acetaminophen (NORCO/VICODIN) 5-325 MG per tablet Take 2 tablets by mouth every 6 (six) hours as needed for moderate pain.   11/12/2013 at Unknown  time  . ondansetron (ZOFRAN) 4 MG tablet Take 1 tablet (4 mg total) by mouth every 8 (eight) hours as needed for nausea or vomiting. 20 tablet 0   . oxyCODONE-acetaminophen (PERCOCET/ROXICET) 5-325 MG per tablet Take 1 tablet by mouth every 4 (four) hours as needed for moderate pain or severe pain. 10 tablet 0     Review of Systems  Constitutional: Negative for fever.  Gastrointestinal: Negative for nausea and vomiting.  Genitourinary: Positive for pelvic pain. Negative for dysuria, frequency, urgency, vaginal bleeding and vaginal discharge.   Physical Exam   Blood pressure 118/75, pulse 79, temperature 98.6 F (37 C), temperature source Oral, resp. rate 20, last menstrual period 06/10/2016, currently breastfeeding.  Physical Exam  Nursing note and vitals reviewed. Constitutional: She is oriented to person, place, and time. She appears well-developed and well-nourished. No distress.  HENT:  Head: Normocephalic.  Cardiovascular: Normal rate.   Respiratory: Effort normal.  GI: Soft. There is no tenderness. There is no rebound.  Neurological: She is alert and oriented to person, place, and time.  Skin: Skin is warm and dry.  Psychiatric: She has a normal mood and affect.    Results for orders placed or performed during the hospital encounter of 07/10/16 (from the past 24 hour(s))  Urinalysis, Routine w reflex microscopic     Status: Abnormal   Collection Time: 07/11/16 12:51 AM  Result Value Ref Range   Color, Urine YELLOW YELLOW   APPearance CLEAR  CLEAR   Specific Gravity, Urine 1.023 1.005 - 1.030   pH 6.0 5.0 - 8.0   Glucose, UA NEGATIVE NEGATIVE mg/dL   Hgb urine dipstick NEGATIVE NEGATIVE   Bilirubin Urine NEGATIVE NEGATIVE   Ketones, ur NEGATIVE NEGATIVE mg/dL   Protein, ur NEGATIVE NEGATIVE mg/dL   Nitrite NEGATIVE NEGATIVE   Leukocytes, UA TRACE (A) NEGATIVE   RBC / HPF 0-5 0 - 5 RBC/hpf   WBC, UA 0-5 0 - 5 WBC/hpf   Bacteria, UA RARE (A) NONE SEEN   Squamous  Epithelial / LPF 0-5 (A) NONE SEEN   Mucous PRESENT   Pregnancy, urine POC     Status: Abnormal   Collection Time: 07/11/16  1:04 AM  Result Value Ref Range   Preg Test, Ur POSITIVE (A) NEGATIVE  hCG, quantitative, pregnancy     Status: Abnormal   Collection Time: 07/11/16  1:50 AM  Result Value Ref Range   hCG, Beta Chain, Quant, S 591 (H) <5 mIU/mL  Wet prep, genital     Status: Abnormal   Collection Time: 07/11/16  2:00 AM  Result Value Ref Range   Yeast Wet Prep HPF POC NONE SEEN NONE SEEN   Trich, Wet Prep NONE SEEN NONE SEEN   Clue Cells Wet Prep HPF POC NONE SEEN NONE SEEN   WBC, Wet Prep HPF POC FEW (A) NONE SEEN   Sperm NONE SEEN    [place] Ob Comp Less 14 Wks  Result Date: 07/11/2016 CLINICAL DATA:  Cramping. Estimated gestational age by LMP is 4 weeks 3 days. Quantitative beta HCG is pending. EXAM: OBSTETRIC <14 WK US AND TRANSVAGINAL OB US TECHNIQUE: Both transabdominal and transvaginal ultrasound examinations were performed for complete evaluation of the gestation as well as the maternal uterus, adnexal regions, and pelvic cul-de-sac. Transvaginal technique was performed to assess early pregnancy. COMPARISON:  None. FINDINGS: Intrauterine gestational sac: No intrauterine gestational sac identified. Yolk sac:  Not Visualized. Embryo:  Not Visualized. Cardiac Activity: Not Visualized. Maternal uterus/adnexae: Uterus is anteverted. No myometrial mass lesions identified. Endometrial stripe thickness appears normal. No abnormal endometrial fluid collections. Both ovaries are visualized and appear normal although best seen on transabdominal imaging. No free fluid in the pelvis. IMPRESSION: No intrauterine gestational sac, yolk sac, or fetal pole identified. Differential considerations include intrauterine pregnancy too early to be sonographically visualized, missed abortion, or ectopic pregnancy. Correlation with quantitative beta HCG is recommended. Followup ultrasound is recommended in 10-14  days for further evaluation. Electronically Signed   By: Burman NievesWilliam  Stevens M.D.   On: 07/11/2016 02:30   [place] Ob Transvaginal  Result Date: 07/11/2016 CLINICAL DATA:  Cramping. Estimated gestational age by LMP is 4 weeks 3 days. Quantitative beta HCG is pending. EXAM: OBSTETRIC <14 WK US AND TRANSVAGINAL OB US TECHNIQUE: Both transabdominal and transvaginal ultrasound examinations were performed for complete evaluation of the gestation as well as the maternal uterus, adnexal regions, and pelvic cul-de-sac. Transvaginal technique was performed to assess early pregnancy. COMPARISON:  None. FINDINGS: Intrauterine gestational sac: No intrauterine gestational sac identified. Yolk sac:  Not Visualized. Embryo:  Not Visualized. Cardiac Activity: Not Visualized. Maternal uterus/adnexae: Uterus is anteverted. No myometrial mass lesions identified. Endometrial stripe thickness appears normal. No abnormal endometrial fluid collections. Both ovaries are visualized and appear normal although best seen on transabdominal imaging. No free fluid in the pelvis. IMPRESSION: No intrauterine gestational sac, yolk sac, or fetal pole identified. Differential considerations include intrauterine pregnancy too early to be sonographically visualized, missed abortion, or  ectopic pregnancy. Correlation with quantitative beta HCG is recommended. Followup ultrasound is recommended in 10-14 days for further evaluation. Electronically Signed   By: Burman Nieves M.D.   On: 07/11/2016 02:30    MAU Course  Procedures  MDM   Assessment and Plan   1. Pregnancy of unknown anatomic location   2. Pelvic pain in pregnancy, antepartum, first trimester    DC home Comfort measures reviewed  1st Trimester precautions  Bleeding precautions Ectopic precautions RX: none  Return to MAU as needed Return Sunday for repeat HCG   Follow-up Information    [facility] [place] [facility]ERN[facility] ADMISSIONS Follow up.   Why:  Sunday  morning for repeat bloodwork  Contact information: [address] [id number] mc [place] [address] [phone]           Tawnya Crook 07/11/2016, 1:30 AM

## 2016-07-11 NOTE — MAU Note (Signed)
PT  SAYS SHE STARTED   CRAMPING  ON SAT - NO BLEEDING.   . NO MEDS  FOR  CRAMPS  LAST SEX-    Monday.       HAS  BEEN  TAKING   BC  PILLS-  BUT  HAS  FORGOTTEN     2 DAYS.

## 2016-07-12 LAB — GC/CHLAMYDIA PROBE AMP (~~LOC~~) NOT AT ARMC
Chlamydia: NEGATIVE
NEISSERIA GONORRHEA: NEGATIVE

## 2016-07-12 LAB — RPR: RPR Ser Ql: NONREACTIVE

## 2016-07-12 LAB — HIV ANTIBODY (ROUTINE TESTING W REFLEX): HIV Screen 4th Generation wRfx: NONREACTIVE

## 2016-07-13 ENCOUNTER — Inpatient Hospital Stay (HOSPITAL_COMMUNITY)
Admission: AD | Admit: 2016-07-13 | Discharge: 2016-07-13 | Disposition: A | Discharge status: Home / Self Care | Payer: No Typology Code available for payment source | Source: Ambulatory Visit | Attending: Family Medicine | Admitting: Family Medicine

## 2016-07-13 ENCOUNTER — Encounter (HOSPITAL_COMMUNITY): Payer: Self-pay | Admitting: *Deleted

## 2016-07-13 DIAGNOSIS — O99511 Diseases of the respiratory system complicating pregnancy, first trimester: Secondary | ICD-10-CM | POA: Insufficient documentation

## 2016-07-13 DIAGNOSIS — O3680X0 Pregnancy with inconclusive fetal viability, not applicable or unspecified: Secondary | ICD-10-CM | POA: Insufficient documentation

## 2016-07-13 DIAGNOSIS — Z7689 Persons encountering health services in other specified circumstances: Secondary | ICD-10-CM | POA: Insufficient documentation

## 2016-07-13 DIAGNOSIS — Z3A01 Less than 8 weeks gestation of pregnancy: Secondary | ICD-10-CM | POA: Insufficient documentation

## 2016-07-13 DIAGNOSIS — Z3490 Encounter for supervision of normal pregnancy, unspecified, unspecified trimester: Secondary | ICD-10-CM

## 2016-07-13 DIAGNOSIS — J45909 Unspecified asthma, uncomplicated: Secondary | ICD-10-CM | POA: Insufficient documentation

## 2016-07-13 LAB — HCG, QUANTITATIVE, PREGNANCY: hCG, Beta Chain, Quant, S: 2038 m[IU]/mL — ABNORMAL HIGH (ref <5)

## 2016-07-13 NOTE — MAU Note (Signed)
Doing ok. Cramping in lower abd and low back. No bleeding.

## 2016-07-13 NOTE — Discharge Instructions (Signed)
First Trimester of Pregnancy The first trimester of pregnancy is from week 1 until the end of week 13 (months 1 through 3). During this time, your baby will begin to develop inside you. At 6-8 weeks, the eyes and face are formed, and the heartbeat can be seen on ultrasound. At the end of 12 weeks, all the baby's organs are formed. Prenatal care is all the medical care you receive before the birth of your baby. Make sure you get good prenatal care and follow all of your doctor's instructions. Follow these instructions at home: Medicines  Take over-the-counter and prescription medicines only as told by your doctor. Some medicines are safe and some medicines are not safe during pregnancy.  Take a prenatal vitamin that contains at least 600 micrograms (mcg) of folic acid.  If you have trouble pooping (constipation), take medicine that will make your stool soft (stool softener) if your doctor approves. Eating and drinking  Eat regular, healthy meals.  Your doctor will tell you the amount of weight gain that is right for you.  Avoid raw meat and uncooked cheese.  If you feel sick to your stomach (nauseous) or throw up (vomit): ? Eat 4 or 5 small meals a day instead of 3 large meals. ? Try eating a few soda crackers. ? Drink liquids between meals instead of during meals.  To prevent constipation: ? Eat foods that are high in fiber, like fresh fruits and vegetables, whole grains, and beans. ? Drink enough fluids to keep your pee (urine) clear or pale yellow. Activity  Exercise only as told by your doctor. Stop exercising if you have cramps or pain in your lower belly (abdomen) or low back.  Do not exercise if it is too hot, too humid, or if you are in a place of great height (high altitude).  Try to avoid standing for long periods of time. Move your legs often if you must stand in one place for a long time.  Avoid heavy lifting.  Wear low-heeled shoes. Sit and stand up straight.  You  can have sex unless your doctor tells you not to. Relieving pain and discomfort  Wear a good support bra if your breasts are sore.  Take warm water baths (sitz baths) to soothe pain or discomfort caused by hemorrhoids. Use hemorrhoid cream if your doctor says it is okay.  Rest with your legs raised if you have leg cramps or low back pain.  If you have puffy, bulging veins (varicose veins) in your legs: ? Wear support hose or compression stockings as told by your doctor. ? Raise (elevate) your feet for 15 minutes, 3-4 times a day. ? Limit salt in your food. Prenatal care  Schedule your prenatal visits by the twelfth week of pregnancy.  Write down your questions. Take them to your prenatal visits.  Keep all your prenatal visits as told by your doctor. This is important. Safety  Wear your seat belt at all times when driving.  Make a list of emergency phone numbers. The list should include numbers for family, friends, the hospital, and police and fire departments. General instructions  Ask your doctor for a referral to a local prenatal class. Begin classes no later than at the start of month 6 of your pregnancy.  Ask for help if you need counseling or if you need help with nutrition. Your doctor can give you advice or tell you where to go for help.  Do not use hot tubs, steam rooms, or   saunas.  Do not douche or use tampons or scented sanitary pads.  Do not cross your legs for long periods of time.  Avoid all herbs and alcohol. Avoid drugs that are not approved by your doctor.  Do not use any tobacco products, including cigarettes, chewing tobacco, and electronic cigarettes. If you need help quitting, ask your doctor. You may get counseling or other support to help you quit.  Avoid cat litter boxes and soil used by cats. These carry germs that can cause birth defects in the baby and can cause a loss of your baby (miscarriage) or stillbirth.  Visit your dentist. At home, brush  your teeth with a soft toothbrush. Be gentle when you floss. Contact a doctor if:  You are dizzy.  You have mild cramps or pressure in your lower belly.  You have a nagging pain in your belly area.  You continue to feel sick to your stomach, you throw up, or you have watery poop (diarrhea).  You have a bad smelling fluid coming from your vagina.  You have pain when you pee (urinate).  You have increased puffiness (swelling) in your face, hands, legs, or ankles. Get help right away if:  You have a fever.  You are leaking fluid from your vagina.  You have spotting or bleeding from your vagina.  You have very bad belly cramping or pain.  You gain or lose weight rapidly.  You throw up blood. It may look like coffee grounds.  You are around people who have German measles, fifth disease, or chickenpox.  You have a very bad headache.  You have shortness of breath.  You have any kind of trauma, such as from a fall or a car accident. Summary  The first trimester of pregnancy is from week 1 until the end of week 13 (months 1 through 3).  To take care of yourself and your unborn baby, you will need to eat healthy meals, take medicines only if your doctor tells you to do so, and do activities that are safe for you and your baby.  Keep all follow-up visits as told by your doctor. This is important as your doctor will have to ensure that your baby is healthy and growing well. This information is not intended to replace advice given to you by your health care provider. Make sure you discuss any questions you have with your health care provider. Document Released: 10/01/2007 Document Revised: 04/22/2016 Document Reviewed: 04/22/2016 Elsevier Interactive Patient Education  2017 Elsevier Inc.  

## 2016-07-13 NOTE — MAU Provider Note (Signed)
[name]  is a 26 y.o. [id number] at 6043w5d by LMP who presents to the [facility] today for follow-up quant hCG after 48 hours.   RN Note: Doing ok. Cramping in lower abd and low back. No bleeding.  The patient was seen in MAU on 07/11/16  quant hCG of 591  US showed Nothing in uterus.    She Endorses mild crampy pain in lower abdomen.  Nothing makes it better or worse,  Has not taken anything Denies vaginal bleeding  Denies fever   Denies nausea or vomiting  OB History  Gravida Para Term Preterm AB Living  4 3 2 1   3   SAB TAB Ectopic Multiple Live Births          2    # Outcome Date GA Lbr Len/2nd Weight Sex Delivery Anes PTL Lv  4 Current           3 Term 09/17/12 6750w1d 08:19 / 00:28 8 lb 3 oz (3.714 kg) M Vag-Spont EPI  LIV  2 Preterm 2010 3156w0d   M CS-LTranv EPI Y LIV     Birth Comments: NICU x 4 weeks  1 Term 2008 9470w0d   F Vag-Spont EPI N       Past Medical History:  Diagnosis Date  . Asthma   . Gall stones   . Seasonal allergies    ROS: Alert and oriented, denies fever, chills, malaise Denies bleeding Reports mild cramping Denies nausea or vomiting   BP 114/66 (BP Location: Right Arm)   Pulse 83   Temp 98 F (36.7 C) (Oral)   Resp 16   Wt 205 lb 8 oz (93.2 kg)   LMP 06/10/2016   SpO2 100%   BMI 37.59 kg/m   CONSTITUTIONAL: Well-developed, well-nourished female in no acute distress.  MUSCULOSKELETAL: Normal range of motion.  CARDIOVASCULAR: Regular heart rate RESPIRATORY: Normal effort NEUROLOGICAL: Alert and oriented to person, place, and time.  SKIN: Skin is warm and dry. No rash noted. Not diaphoretic. No erythema. No pallor. PSYCH: Normal mood and affect. Normal behavior. Normal judgment and thought content.  Results for orders placed or performed during the hospital encounter of 07/13/16 (from the past 24 hour(s))  hCG, quantitative, pregnancy     Status: Abnormal   Collection Time: 07/13/16 11:25 AM  Result Value Ref  Range   hCG, Beta Chain, Quant, S 2,038 (H) <5 mIU/mL   Last HCG:      Ref. Range 07/11/2016 01:50  HCG, Beta Chain, Quant, S Latest Ref Range: <5 mIU/mL 591 (H)    A: Appropriate rise in quant hCG after 48 hours  P: Discharge home First trimester/ectopic precautions discussed Patient will return for follow-up US in 1 week. Order placed. [facility]  Patient may return to MAU as needed or if her condition were to change or worsen   Aviva SignsMarie L Daquan Crapps, CNM 07/13/2016 12:04 PM

## 2016-07-17 ENCOUNTER — Ambulatory Visit (HOSPITAL_COMMUNITY)
Admission: RE | Admit: 2016-07-17 | Discharge: 2016-07-17 | Disposition: A | Discharge status: Home / Self Care | Payer: No Typology Code available for payment source | Source: Ambulatory Visit | Attending: Advanced Practice Midwife | Admitting: Advanced Practice Midwife

## 2016-07-17 ENCOUNTER — Encounter: Payer: Self-pay | Admitting: Family Medicine

## 2016-07-17 ENCOUNTER — Ambulatory Visit: Payer: No Typology Code available for payment source | Admitting: *Deleted

## 2016-07-17 DIAGNOSIS — O3680X0 Pregnancy with inconclusive fetal viability, not applicable or unspecified: Secondary | ICD-10-CM

## 2016-07-17 DIAGNOSIS — Z3A01 Less than 8 weeks gestation of pregnancy: Secondary | ICD-10-CM | POA: Insufficient documentation

## 2016-07-17 DIAGNOSIS — O209 Hemorrhage in early pregnancy, unspecified: Secondary | ICD-10-CM | POA: Insufficient documentation

## 2016-07-17 DIAGNOSIS — Z3490 Encounter for supervision of normal pregnancy, unspecified, unspecified trimester: Secondary | ICD-10-CM

## 2016-07-17 NOTE — Progress Notes (Signed)
Here for results of ultrasound. Reviewed with Wynelle BourgeoisMarie Williams, CNM and informed patient ultrasound shows she is still too early to see the baby but does confirm pregnancy is in correct place and she should start prenatal care. Reviewed meds with patient . She is not sure where she will go for prenatal care.  Sent to registar to check out and obtain information about where to go for prenatal care.reviewed meds with her.

## 2016-07-18 ENCOUNTER — Encounter: Payer: Self-pay | Admitting: Advanced Practice Midwife

## 2016-07-18 DIAGNOSIS — R109 Unspecified abdominal pain: Secondary | ICD-10-CM

## 2016-07-18 DIAGNOSIS — O26891 Other specified pregnancy related conditions, first trimester: Secondary | ICD-10-CM | POA: Insufficient documentation

## 2016-09-04 ENCOUNTER — Inpatient Hospital Stay (HOSPITAL_COMMUNITY)
Admission: AD | Admit: 2016-09-04 | Discharge: 2016-09-04 | Disposition: A | Discharge status: Home / Self Care | Payer: No Typology Code available for payment source | Source: Ambulatory Visit | Attending: Obstetrics and Gynecology | Admitting: Obstetrics and Gynecology

## 2016-09-04 ENCOUNTER — Encounter (HOSPITAL_COMMUNITY): Payer: Self-pay | Admitting: *Deleted

## 2016-09-04 DIAGNOSIS — R1030 Lower abdominal pain, unspecified: Secondary | ICD-10-CM | POA: Insufficient documentation

## 2016-09-04 DIAGNOSIS — Z3A12 12 weeks gestation of pregnancy: Secondary | ICD-10-CM | POA: Insufficient documentation

## 2016-09-04 DIAGNOSIS — N930 Postcoital and contact bleeding: Secondary | ICD-10-CM | POA: Insufficient documentation

## 2016-09-04 DIAGNOSIS — J45909 Unspecified asthma, uncomplicated: Secondary | ICD-10-CM | POA: Insufficient documentation

## 2016-09-04 DIAGNOSIS — O43891 Other placental disorders, first trimester: Secondary | ICD-10-CM | POA: Insufficient documentation

## 2016-09-04 DIAGNOSIS — R109 Unspecified abdominal pain: Secondary | ICD-10-CM

## 2016-09-04 DIAGNOSIS — O99511 Diseases of the respiratory system complicating pregnancy, first trimester: Secondary | ICD-10-CM | POA: Insufficient documentation

## 2016-09-04 DIAGNOSIS — O468X1 Other antepartum hemorrhage, first trimester: Secondary | ICD-10-CM

## 2016-09-04 DIAGNOSIS — Z3491 Encounter for supervision of normal pregnancy, unspecified, first trimester: Secondary | ICD-10-CM

## 2016-09-04 DIAGNOSIS — O418X10 Other specified disorders of amniotic fluid and membranes, first trimester, not applicable or unspecified: Secondary | ICD-10-CM

## 2016-09-04 DIAGNOSIS — Z79899 Other long term (current) drug therapy: Secondary | ICD-10-CM | POA: Insufficient documentation

## 2016-09-04 DIAGNOSIS — O26891 Other specified pregnancy related conditions, first trimester: Secondary | ICD-10-CM | POA: Insufficient documentation

## 2016-09-04 LAB — URINALYSIS, ROUTINE W REFLEX MICROSCOPIC
BILIRUBIN URINE: NEGATIVE
Glucose, UA: NEGATIVE mg/dL
Ketones, ur: 5 mg/dL — AB
LEUKOCYTES UA: NEGATIVE
NITRITE: NEGATIVE
PH: 5 (ref 5.0–8.0)
PROTEIN: NEGATIVE mg/dL
Specific Gravity, Urine: 1.023 (ref 1.005–1.030)

## 2016-09-04 NOTE — MAU Note (Signed)
Pt had some spotting last night  Now more pink. Reports some cramping that is getting worse.

## 2016-09-04 NOTE — MAU Provider Note (Signed)
History     CSN: [id number]  Arrival date and time: 09/04/16 1425  First Provider Initiated Contact with Patient 09/04/16 1630      Chief Complaint  Patient presents with  . Vaginal Bleeding  . Abdominal Cramping   HPI [name] is a 26 y.o. [phone] at 154w2d who presents with abdominal cramping & vaginal bleeding. Symptoms began last night after intercourse. Reports pink spotting on toilet paper only; not bleeding into underwear or passing clots. Lower abdominal cramping is intermittent. Rates pain 7/10. Has not treated. Nothing makes better or worse. Denies n/v/d, constipation, dysuria, or vaginal discharge.   OB History    Gravida Para Term Preterm AB Living   4 3 2 1   3    SAB TAB Ectopic Multiple Live Births           2      Past Medical History:  Diagnosis Date  . Asthma   . Gall stones   . Seasonal allergies     Past Surgical History:  Procedure Laterality Date  . CESAREAN SECTION     C/S x 1  . CHOLECYSTECTOMY    . CHOLECYSTECTOMY      History reviewed. No pertinent family history.  Social History  Substance Use Topics  . Smoking status: Never Smoker  . Smokeless tobacco: Never Used  . Alcohol use No    Allergies:  Allergies  Allergen Reactions  . Avocado Anaphylaxis  . Norco [Hydrocodone-Acetaminophen] Nausea Only    dizziness  . Strawberry Extract Hives, Shortness Of Breath and Swelling    Prescriptions Prior to Admission  Medication Sig Dispense Refill Last Dose  . albuterol (PROVENTIL HFA;VENTOLIN HFA) 108 (90 BASE) MCG/ACT inhaler Inhale 2 puffs into the lungs every 6 (six) hours as needed for wheezing or shortness of breath.   Taking  . fexofenadine (ALLEGRA) 30 MG tablet Take 30 mg by mouth 2 (two) times daily.   Taking    Review of Systems  Constitutional: Negative.   Gastrointestinal: Positive for abdominal pain. Negative for constipation, diarrhea, nausea and vomiting.  Genitourinary: Positive for vaginal bleeding.  Negative for dyspareunia, dysuria and vaginal discharge.   Physical Exam   Blood pressure 118/70, pulse 85, temperature 98.4 F (36.9 C), resp. rate 18, height 5\' 2"  (1.575 m), weight 193 lb 0.6 oz (87.6 kg), last menstrual period 06/10/2016, currently breastfeeding.  Physical Exam  Nursing note and vitals reviewed. Constitutional: She is oriented to person, place, and time. She appears well-developed and well-nourished. No distress.  HENT:  Head: Normocephalic and atraumatic.  Eyes: Conjunctivae are normal. Right eye exhibits no discharge. Left eye exhibits no discharge. No scleral icterus.  Neck: Normal range of motion.  Respiratory: Effort normal. No respiratory distress.  GI: Soft. She exhibits no distension. There is no tenderness. There is no rebound and no guarding.  Genitourinary: Cervix exhibits no friability. No bleeding in the vagina. Vaginal discharge (small amount of white mucoid discharge) found.  Genitourinary Comments: Cervix closed/thick  Neurological: She is alert and oriented to person, place, and time.  Skin: Skin is warm and dry. She is not diaphoretic.  Psychiatric: She has a normal mood and affect. Her behavior is normal. Judgment and thought content normal.    MAU Course  Procedures Results for orders placed or performed during the hospital encounter of 09/04/16 (from the past 24 hour(s))  Urinalysis, Routine w reflex microscopic     Status: Abnormal   Collection Time: 09/04/16  3:50 PM  Result  Value Ref Range   Color, Urine YELLOW YELLOW   APPearance CLEAR CLEAR   Specific Gravity, Urine 1.023 1.005 - 1.030   pH 5.0 5.0 - 8.0   Glucose, UA NEGATIVE NEGATIVE mg/dL   Hgb urine dipstick SMALL (A) NEGATIVE   Bilirubin Urine NEGATIVE NEGATIVE   Ketones, ur 5 (A) NEGATIVE mg/dL   Protein, ur NEGATIVE NEGATIVE mg/dL   Nitrite NEGATIVE NEGATIVE   Leukocytes, UA NEGATIVE NEGATIVE   RBC / HPF 0-5 0 - 5 RBC/hpf   WBC, UA 0-5 0 - 5 WBC/hpf   Bacteria, UA RARE  (A) NONE SEEN   Squamous Epithelial / LPF 0-5 (A) NONE SEEN   Mucous PRESENT     MDM FHT 170 O positive Previous scan showed [facility] SSE performed -- no blood on exam Cervix closed Assessment and Plan  A; 1. Postcoital bleeding   2. Abdominal pain during pregnancy in first trimester   3. Subchorionic hematoma in first trimester, single or unspecified fetus   4. Fetal heart tones present, first trimester    P: Discharge home Take tylenol prn pain Discussed reasons to return to MAU Pelvic rest Keep f/u with OB  Judeth Horn 09/04/2016, 4:29 PM

## 2016-09-04 NOTE — Discharge Instructions (Signed)
Vaginal Bleeding During Pregnancy, First Trimester °A small amount of bleeding (spotting) from the vagina is relatively common in early pregnancy. It usually stops on its own. Various things may cause bleeding or spotting in early pregnancy. Some bleeding may be related to the pregnancy, and some may not. In most cases, the bleeding is normal and is not a problem. However, bleeding can also be a sign of something serious. Be sure to tell your health care provider about any vaginal bleeding right away. °Some possible causes of vaginal bleeding during the first trimester include: °· Infection or inflammation of the cervix. °· Growths (polyps) on the cervix. °· Miscarriage or threatened miscarriage. °· Pregnancy tissue has developed outside of the uterus and in a fallopian tube (tubal pregnancy). °· Tiny cysts have developed in the uterus instead of pregnancy tissue (molar pregnancy). °Follow these instructions at home: °Watch your condition for any changes. The following actions may help to lessen any discomfort you are feeling: °· Follow your health care provider's instructions for limiting your activity. If your health care provider orders bed rest, you may need to stay in bed and only get up to use the bathroom. However, your health care provider may allow you to continue light activity. °· If needed, make plans for someone to help with your regular activities and responsibilities while you are on bed rest. °· Keep track of the number of pads you use each day, how often you change pads, and how soaked (saturated) they are. Write this down. °· Do not use tampons. Do not douche. °· Do not have sexual intercourse or orgasms until approved by your health care provider. °· If you pass any tissue from your vagina, save the tissue so you can show it to your health care provider. °· Only take over-the-counter or prescription medicines as directed by your health care provider. °· Do not take aspirin because it can make you  bleed. °· Keep all follow-up appointments as directed by your health care provider. °Contact a health care provider if: °· You have any vaginal bleeding during any part of your pregnancy. °· You have cramps or labor pains. °· You have a fever, not controlled by medicine. °Get help right away if: °· You have severe cramps in your back or belly (abdomen). °· You pass large clots or tissue from your vagina. °· Your bleeding increases. °· You feel light-headed or weak, or you have fainting episodes. °· You have chills. °· You are leaking fluid or have a gush of fluid from your vagina. °· You pass out while having a bowel movement. °This information is not intended to replace advice given to you by your health care provider. Make sure you discuss any questions you have with your health care provider. °Document Released: 01/22/2005 Document Revised: 09/20/2015 Document Reviewed: 12/20/2012 °Elsevier Interactive Patient Education © 2017 Elsevier Inc. ° °

## 2016-09-09 ENCOUNTER — Ambulatory Visit (INDEPENDENT_AMBULATORY_CARE_PROVIDER_SITE_OTHER): Payer: Self-pay | Admitting: Advanced Practice Midwife

## 2016-09-09 ENCOUNTER — Encounter: Payer: Self-pay | Admitting: Advanced Practice Midwife

## 2016-09-09 ENCOUNTER — Ambulatory Visit: Payer: Self-pay

## 2016-09-09 VITALS — BP 107/68 | HR 91 | Wt 193.0 lb

## 2016-09-09 DIAGNOSIS — O34219 Maternal care for unspecified type scar from previous cesarean delivery: Secondary | ICD-10-CM

## 2016-09-09 DIAGNOSIS — Z3480 Encounter for supervision of other normal pregnancy, unspecified trimester: Secondary | ICD-10-CM | POA: Insufficient documentation

## 2016-09-09 DIAGNOSIS — Z3481 Encounter for supervision of other normal pregnancy, first trimester: Secondary | ICD-10-CM

## 2016-09-09 DIAGNOSIS — Z23 Encounter for immunization: Secondary | ICD-10-CM

## 2016-09-09 DIAGNOSIS — O219 Vomiting of pregnancy, unspecified: Secondary | ICD-10-CM

## 2016-09-09 DIAGNOSIS — O3680X0 Pregnancy with inconclusive fetal viability, not applicable or unspecified: Secondary | ICD-10-CM

## 2016-09-09 DIAGNOSIS — Z3400 Encounter for supervision of normal first pregnancy, unspecified trimester: Secondary | ICD-10-CM

## 2016-09-09 LAB — POCT URINALYSIS DIP (DEVICE)
BILIRUBIN URINE: NEGATIVE
Glucose, UA: NEGATIVE mg/dL
Ketones, ur: NEGATIVE mg/dL
Nitrite: NEGATIVE
Protein, ur: NEGATIVE mg/dL
Specific Gravity, Urine: 1.02 (ref 1.005–1.030)
Urobilinogen, UA: 0.2 mg/dL (ref 0.0–1.0)
pH: 6 (ref 5.0–8.0)

## 2016-09-09 MED ORDER — PROMETHAZINE HCL 25 MG PO TABS: 25.0000 mg | ORAL_TABLET | Freq: Four times a day (QID) | ORAL | 2 refills | Status: AC | PRN

## 2016-09-09 NOTE — Patient Instructions (Addendum)
Safe Medications in Pregnancy   Acne: Benzoyl Peroxide Salicylic Acid  Backache/Headache: Tylenol: 2 regular strength every 4 hours OR              2 Extra strength every 6 hours  Colds/Coughs/Allergies: Benadryl (alcohol free) 25 mg every 6 hours as needed Breath right strips Claritin Cepacol throat lozenges Chloraseptic throat spray Cold-Eeze- up to three times per day Cough drops, alcohol free Flonase (by prescription only) Guaifenesin Mucinex Robitussin DM (plain only, alcohol free) Saline nasal spray/drops Sudafed (pseudoephedrine) & Actifed ** use only after [redacted] weeks gestation and if you do not have high blood pressure Tylenol Vicks Vaporub Zinc lozenges Zyrtec   Constipation: Colace Ducolax suppositories Fleet enema Glycerin suppositories Metamucil Milk of magnesia Miralax Senokot Smooth move tea  Diarrhea: Kaopectate Imodium A-D  *NO pepto Bismol  Hemorrhoids: Anusol Anusol HC Preparation H Tucks  Indigestion: Tums Maalox Mylanta Zantac  Pepcid  Insomnia: Benadryl (alcohol free) 25mg  every 6 hours as needed Tylenol PM Unisom, no Gelcaps  Leg Cramps: Tums MagGel  Nausea/Vomiting:  Bonine Dramamine Emetrol Ginger extract Sea bands Meclizine  Nausea medication to take during pregnancy:  Unisom (doxylamine succinate 25 mg tablets) Take one tablet daily at bedtime. If symptoms are not adequately controlled, the dose can be increased to a maximum recommended dose of two tablets daily (1/2 tablet in the morning, 1/2 tablet mid-afternoon and one at bedtime). Vitamin B6 100mg  tablets. Take one tablet twice a day (up to 200 mg per day).  Skin Rashes: Aveeno products Benadryl cream or 25mg  every 6 hours as needed Calamine Lotion 1% cortisone cream  Yeast infection: Gyne-lotrimin 7 Monistat 7   **If taking multiple medications, please check labels to avoid duplicating the same active ingredients **take medication as directed on  the label ** Do not exceed 4000 mg of tylenol in 24 hours **Do not take medications that contain aspirin or ibuprofen     Second Trimester of Pregnancy The second trimester is from week 14 through week 27 (months 4 through 6). The second trimester is often a time when you feel your best. Your body has adjusted to being pregnant, and you begin to feel better physically. Usually, morning sickness has lessened or quit completely, you may have more energy, and you may have an increase in appetite. The second trimester is also a time when the fetus is growing rapidly. At the end of the sixth month, the fetus is about 9 inches long and weighs about 1 pounds. You will likely begin to feel the baby move (quickening) between 16 and 20 weeks of pregnancy. Body changes during your second trimester Your body continues to go through many changes during your second trimester. The changes vary from woman to woman.  Your weight will continue to increase. You will notice your lower abdomen bulging out.  You may begin to get stretch marks on your hips, abdomen, and breasts.  You may develop headaches that can be relieved by medicines. The medicines should be approved by your health care provider.  You may urinate more often because the fetus is pressing on your bladder.  You may develop or continue to have heartburn as a result of your pregnancy.  You may develop constipation because certain hormones are causing the muscles that push waste through your intestines to slow down.  You may develop hemorrhoids or swollen, bulging veins (varicose veins).  You may have back pain. This is caused by:  Weight gain.  Pregnancy hormones that are  relaxing the joints in your pelvis.  A shift in weight and the muscles that support your balance.  Your breasts will continue to grow and they will continue to become tender.  Your gums may bleed and may be sensitive to brushing and flossing.  Dark spots or blotches  (chloasma, mask of pregnancy) may develop on your face. This will likely fade after the baby is born.  A dark line from your belly button to the pubic area (linea nigra) may appear. This will likely fade after the baby is born.  You may have changes in your hair. These can include thickening of your hair, rapid growth, and changes in texture. Some women also have hair loss during or after pregnancy, or hair that feels dry or thin. Your hair will most likely return to normal after your baby is born. What to expect at prenatal visits During a routine prenatal visit:  You will be weighed to make sure you and the fetus are growing normally.  Your blood pressure will be taken.  Your abdomen will be measured to track your baby's growth.  The fetal heartbeat will be listened to.  Any test results from the previous visit will be discussed. Your health care provider may ask you:  How you are feeling.  If you are feeling the baby move.  If you have had any abnormal symptoms, such as leaking fluid, bleeding, severe headaches, or abdominal cramping.  If you are using any tobacco products, including cigarettes, chewing tobacco, and electronic cigarettes.  If you have any questions. Other tests that may be performed during your second trimester include:  Blood tests that check for:  Low iron levels (anemia).  High blood sugar that affects pregnant women (gestational diabetes) between 5124 and 28 weeks.  Rh antibodies. This is to check for a protein on red blood cells (Rh factor).  Urine tests to check for infections, diabetes, or protein in the urine.  An ultrasound to confirm the proper growth and development of the baby.  An amniocentesis to check for possible genetic problems.  Fetal screens for spina bifida and Down syndrome.  HIV (human immunodeficiency virus) testing. Routine prenatal testing includes screening for HIV, unless you choose not to have this test. Follow these  instructions at home: Medicines   Follow your health care provider's instructions regarding medicine use. Specific medicines may be either safe or unsafe to take during pregnancy.  Take a prenatal vitamin that contains at least 600 micrograms (mcg) of folic acid.  If you develop constipation, try taking a stool softener if your health care provider approves. Eating and drinking   Eat a balanced diet that includes fresh fruits and vegetables, whole grains, good sources of protein such as meat, eggs, or tofu, and low-fat dairy. Your health care provider will help you determine the amount of weight gain that is right for you.  Avoid raw meat and uncooked cheese. These carry germs that can cause birth defects in the baby.  If you have low calcium intake from food, talk to your health care provider about whether you should take a daily calcium supplement.  Limit foods that are high in fat and processed sugars, such as fried and sweet foods.  To prevent constipation:  Drink enough fluid to keep your urine clear or pale yellow.  Eat foods that are high in fiber, such as fresh fruits and vegetables, whole grains, and beans. Activity   Exercise only as directed by your health care provider. Most  women can continue their usual exercise routine during pregnancy. Try to exercise for 30 minutes at least 5 days a week. Stop exercising if you experience uterine contractions.  Avoid heavy lifting, wear low heel shoes, and practice good posture.  A sexual relationship may be continued unless your health care provider directs you otherwise. Relieving pain and discomfort   Wear a good support bra to prevent discomfort from breast tenderness.  Take warm sitz baths to soothe any pain or discomfort caused by hemorrhoids. Use hemorrhoid cream if your health care provider approves.  Rest with your legs elevated if you have leg cramps or low back pain.  If you develop varicose veins, wear support hose.  Elevate your feet for 15 minutes, 3-4 times a day. Limit salt in your diet. Prenatal Care   Write down your questions. Take them to your prenatal visits.  Keep all your prenatal visits as told by your health care provider. This is important. Safety   Wear your seat belt at all times when driving.  Make a list of emergency phone numbers, including numbers for family, friends, the hospital, and police and fire departments. General instructions   Ask your health care provider for a referral to a local prenatal education class. Begin classes no later than the beginning of month 6 of your pregnancy.  Ask for help if you have counseling or nutritional needs during pregnancy. Your health care provider can offer advice or refer you to specialists for help with various needs.  Do not use hot tubs, steam rooms, or saunas.  Do not douche or use tampons or scented sanitary pads.  Do not cross your legs for long periods of time.  Avoid cat litter boxes and soil used by cats. These carry germs that can cause birth defects in the baby and possibly loss of the fetus by miscarriage or stillbirth.  Avoid all smoking, herbs, alcohol, and unprescribed drugs. Chemicals in these products can affect the formation and growth of the baby.  Do not use any products that contain nicotine or tobacco, such as cigarettes and e-cigarettes. If you need help quitting, ask your health care provider.  Visit your dentist if you have not gone yet during your pregnancy. Use a soft toothbrush to brush your teeth and be gentle when you floss. Contact a health care provider if:  You have dizziness.  You have mild pelvic cramps, pelvic pressure, or nagging pain in the abdominal area.  You have persistent nausea, vomiting, or diarrhea.  You have a bad smelling vaginal discharge.  You have pain when you urinate. Get help right away if:  You have a fever.  You are leaking fluid from your vagina.  You have spotting  or bleeding from your vagina.  You have severe abdominal cramping or pain.  You have rapid weight gain or weight loss.  You have shortness of breath with chest pain.  You notice sudden or extreme swelling of your face, hands, ankles, feet, or legs.  You have not felt your baby move in over an hour.  You have severe headaches that do not go away when you take medicine.  You have vision changes. Summary  The second trimester is from week 14 through week 27 (months 4 through 6). It is also a time when the fetus is growing rapidly.  Your body goes through many changes during pregnancy. The changes vary from woman to woman.  Avoid all smoking, herbs, alcohol, and unprescribed drugs. These chemicals affect the  formation and growth your baby.  Do not use any tobacco products, such as cigarettes, chewing tobacco, and e-cigarettes. If you need help quitting, ask your health care provider.  Contact your health care provider if you have any questions. Keep all prenatal visits as told by your health care provider. This is important. This information is not intended to replace advice given to you by your health care provider. Make sure you discuss any questions you have with your health care provider. Document Released: 04/08/2001 Document Revised: 09/20/2015 Document Reviewed: 06/15/2012 Elsevier Interactive Patient Education  2017 [employer].  Trial of Labor After Cesarean Delivery A trial of labor after cesarean delivery (TOLAC) is when a woman tries to give birth vaginally after a previous cesarean delivery. TOLAC may be a safe and appropriate option for you depending on your medical history and other risk factors. When TOLAC is successful and you are able to have a vaginal delivery, this is called a vaginal birth after cesarean delivery (VBAC). Candidates for TOLAC TOLAC is possible for some women who:  Have undergone one or two prior cesarean deliveries in which the incision of the  uterus was horizontal (low transverse).  Are carrying twins and have had one prior low transverse incision during a cesarean delivery.  Do not have a vertical (classical) uterine scar.  Have not had a tear in the wall of their uterus (uterine rupture). TOLAC is also supported for women who meet appropriate criteria and:  Are under the age of 40 years.  Are tall and have a body mass index (BMI) of less than 30.  Have an unknown uterine scar.  Give birth in a facility equipped to handle an emergency cesarean delivery. This team should be able to handle possible complications such as a uterine rupture.  Have thorough counseling about the benefits and risks of TOLAC.  Have discussed future pregnancy plans with their health care provider.  Plan to have several more pregnancies. Most successful candidates for TOLAC:  Have had a successful vaginal delivery before or after their cesarean delivery.  Experience labor that begins naturally on or before the due date (40 weeks of gestation).  Do not have a very large (macrosomic) baby.  Had a prior cesarean delivery but are not currently experiencing factors that would prompt a cesarean delivery (such as a breech position).  Had only one prior cesarean delivery.  Had a prior cesarean delivery that was performed early in labor and not after full cervical dilation. TOLAC may be most appropriate for women who meet the above guidelines and who plan to have more pregnancies. TOLAC is not recommended for home births. Least successful candidates for TOLAC:  Have an induced labor with an unfavorable cervix. An unfavorable cervix is when the cervix is not dilating enough (among other factors).  Have never had a vaginal delivery.  Have had more than two cesarean deliveries.  Have a pregnancy at more than 40 weeks of gestation.  Are pregnant with a baby with a suspected weight greater than 4,000 grams (8 pounds) and who have no prior history  of a vaginal delivery.  Have closely spaced pregnancies. Suggested benefits of TOLAC  You may have a faster recovery time.  You may have a shorter stay in the hospital.  You may have less pain and fewer problems than with a cesarean delivery. Women who have a cesarean delivery have a higher chance of needing blood or getting a fever, an infection, or a blood clot in the  legs. Suggested risks of TOLAC The highest risk of complications happens to women who attempt a TOLAC and fail. A failed TOLAC results in an unplanned cesarean delivery. Risks related to [facility] or repeat cesarean deliveries include:  Blood loss.  Infection.  Blood clot.  Injury to surrounding tissues or organs.  Having to remove the uterus (hysterectomy).  Potential problems with the placenta (such as placenta previa or placenta accreta) in future pregnancies. Although very rare, the main concerns with TOLAC are:  Rupture of the uterine scar from a past cesarean delivery.  Needing an emergency cesarean delivery.  Having a bad outcome for the baby (perinatal morbidity). Where to find more information:  American Congress of Obstetricians and Gynecologists: www.acog.org  [employer]-Midwives: www.midwife.org This information is not intended to replace advice given to you by your health care provider. Make sure you discuss any questions you have with your health care provider. Document Released: 12/31/2010 Document Revised: 03/12/2016 Document Reviewed: 10/04/2012 Elsevier Interactive Patient Education  2017 [employer].

## 2016-09-09 NOTE — Progress Notes (Signed)
  Subjective:    [name] is being seen today for her first obstetrical visit.  This is not a planned pregnancy. She is at 749w0d gestation. Her obstetrical history is significant for C/S x 1 for breech. Relationship with FOB: significant other, living together. Patient does intend to breast feed. Pregnancy history fully reviewed.  Patient reports no complaints.  Review of Systems:   Review of Systems  Constitutional: Negative for chills and fever.  Gastrointestinal: Positive for nausea. Negative for abdominal pain, constipation, diarrhea and vomiting.  Genitourinary: Negative for vaginal bleeding and vaginal discharge.  Neurological: Positive for headaches.    Objective:     BP 107/68   Pulse 91   Wt 193 lb (87.5 kg)   LMP 06/10/2016   BMI 35.30 kg/m  Physical Exam  Nursing note and vitals reviewed. Constitutional: She is oriented to person, place, and time. She appears well-developed and well-nourished.  Eyes: No scleral icterus.  Neck: No thyromegaly present.  Cardiovascular: Normal rate, regular rhythm and normal heart sounds.   Respiratory: Effort normal and breath sounds normal. No respiratory distress.  GI: Soft. She exhibits no distension. There is no tenderness.  Genitourinary:  Genitourinary Comments: Deferred due to recent exam in [facility]  Musculoskeletal: She exhibits no edema or tenderness.  Neurological: She is alert and oriented to person, place, and time. She has normal reflexes.  Skin: Skin is warm and dry.  Psychiatric: She has a normal mood and affect.      Fetal Exam Fetal Monitor Review: Mode: ultrasound.   Baseline rate: 153.         Assessment:    Pregnancy: [id number] Patient Active Problem List   Diagnosis Date Noted  . Supervision of normal first pregnancy, antepartum 09/09/2016  . History of cesarean delivery, currently pregnant 09/09/2016  . Abdominal pain during pregnancy in first trimester 07/18/2016  . Depression with  anxiety 03/23/2012  . Normal IUP (intrauterine pregnancy) on prenatal ultrasound 01/21/2012  . Subchorionic hemorrhage 01/21/2012   1. Supervision of normal first pregnancy, antepartum - OB Panel - Flu Vaccine QUAD 36+ mos IM  2. Encounter to determine fetal viability of pregnancy, single or unspecified fetus  - US OB Limited - Flu Vaccine QUAD 36+ mos IM  3. History of cesarean delivery, currently pregnant  - Plans repeat w/ BTL (sefl pay). Discussed estimated cost. Pt declined LARC.  4. Nausea/vomiting in pregnancy  - promethazine (PHENERGAN) 25 MG tablet; Take 1 tablet (25 mg total) by mouth every 6 (six) hours as needed for nausea or vomiting.  Dispense: 30 tablet; Refill: 2    Plan:     Initial labs drawn. Prenatal vitamins. Problem list reviewed and updated. AFP3 discussed: requested. Role of ultrasound in pregnancy discussed; fetal survey: requested. Amniocentesis discussed: not indicated. Follow up in 4 weeks.   [name] KinsmanVirginia Treveon Newton 09/09/2016

## 2016-09-09 NOTE — Progress Notes (Signed)
Pt informed that the ultrasound is considered a limited OB ultrasound and is not intended to be a complete ultrasound exam.  Patient also informed that the ultrasound is not being completed with the intent of assessing for fetal or placental anomalies or any pelvic abnormalities.  Explained that the purpose of today's ultrasound is to assess for viability.  Patient acknowledges the purpose of the exam and the limitations of the study.    Single IUP FHR - 158bpm Per PW doppler FM present [name] notified

## 2016-09-10 LAB — OBSTETRIC PANEL, INCLUDING HIV
Antibody Screen: NEGATIVE
Basophils Absolute: 0 10*3/uL (ref 0.0–0.2)
Basos: 0 %
EOS (ABSOLUTE): 0.5 10*3/uL — ABNORMAL HIGH (ref 0.0–0.4)
Eos: 5 %
HIV Screen 4th Generation wRfx: NONREACTIVE
Hematocrit: 42.5 % (ref 34.0–46.6)
Hemoglobin: 14.3 g/dL (ref 11.1–15.9)
Hepatitis B Surface Ag: NEGATIVE
Immature Grans (Abs): 0 10*3/uL (ref 0.0–0.1)
Immature Granulocytes: 0 %
Lymphocytes Absolute: 3 10*3/uL (ref 0.7–3.1)
Lymphs: 28 %
MCH: 30.3 pg (ref 26.6–33.0)
MCHC: 33.6 g/dL (ref 31.5–35.7)
MCV: 90 fL (ref 79–97)
MONOCYTES: 5 %
Monocytes Absolute: 0.6 10*3/uL (ref 0.1–0.9)
NEUTROS ABS: 6.6 10*3/uL (ref 1.4–7.0)
Neutrophils: 62 %
PLATELETS: 268 10*3/uL (ref 150–379)
RBC: 4.72 x10E6/uL (ref 3.77–5.28)
RDW: 13.5 % (ref 12.3–15.4)
RPR: NONREACTIVE
RUBELLA: 1.17 {index} (ref >0.99)
Rh Factor: POSITIVE
WBC: 10.8 10*3/uL (ref 3.4–10.8)

## 2016-10-07 ENCOUNTER — Encounter: Payer: Self-pay | Admitting: Advanced Practice Midwife

## 2016-10-07 ENCOUNTER — Ambulatory Visit (INDEPENDENT_AMBULATORY_CARE_PROVIDER_SITE_OTHER): Payer: Self-pay | Admitting: Advanced Practice Midwife

## 2016-10-07 VITALS — BP 91/54 | HR 67 | Wt 195.1 lb

## 2016-10-07 DIAGNOSIS — O34219 Maternal care for unspecified type scar from previous cesarean delivery: Secondary | ICD-10-CM

## 2016-10-07 DIAGNOSIS — Z3482 Encounter for supervision of other normal pregnancy, second trimester: Secondary | ICD-10-CM

## 2016-10-07 DIAGNOSIS — Z98891 History of uterine scar from previous surgery: Secondary | ICD-10-CM

## 2016-10-07 NOTE — Patient Instructions (Signed)
Second Trimester of Pregnancy The second trimester is from week 13 through week 28, month 4 through 6. This is often the time in pregnancy that you feel your best. Often times, morning sickness has lessened or quit. You may have more energy, and you may get hungry more often. Your unborn baby (fetus) is growing rapidly. At the end of the sixth month, he or she is about 9 inches long and weighs about 1 pounds. You will likely feel the baby move (quickening) between 18 and 20 weeks of pregnancy. Follow these instructions at home:  Avoid all smoking, herbs, and alcohol. Avoid drugs not approved by your doctor.  Do not use any tobacco products, including cigarettes, chewing tobacco, and electronic cigarettes. If you need help quitting, ask your doctor. You may get counseling or other support to help you quit.  Only take medicine as told by your doctor. Some medicines are safe and some are not during pregnancy.  Exercise only as told by your doctor. Stop exercising if you start having cramps.  Eat regular, healthy meals.  Wear a good support bra if your breasts are tender.  Do not use hot tubs, steam rooms, or saunas.  Wear your seat belt when driving.  Avoid raw meat, uncooked cheese, and liter boxes and soil used by cats.  Take your prenatal vitamins.  Take 1500-2000 milligrams of calcium daily starting at the 20th week of pregnancy until you deliver your baby.  Try taking medicine that helps you poop (stool softener) as needed, and if your doctor approves. Eat more fiber by eating fresh fruit, vegetables, and whole grains. Drink enough fluids to keep your pee (urine) clear or pale yellow.  Take warm water baths (sitz baths) to soothe pain or discomfort caused by hemorrhoids. Use hemorrhoid cream if your doctor approves.  If you have puffy, bulging veins (varicose veins), wear support hose. Raise (elevate) your feet for 15 minutes, 3-4 times a day. Limit salt in your diet.  Avoid heavy  lifting, wear low heals, and sit up straight.  Rest with your legs raised if you have leg cramps or low back pain.  Visit your dentist if you have not gone during your pregnancy. Use a soft toothbrush to brush your teeth. Be gentle when you floss.  You can have sex (intercourse) unless your doctor tells you not to.  Go to your doctor visits. Get help if:  You feel dizzy.  You have mild cramps or pressure in your lower belly (abdomen).  You have a nagging pain in your belly area.  You continue to feel sick to your stomach (nauseous), throw up (vomit), or have watery poop (diarrhea).  You have bad smelling fluid coming from your vagina.  You have pain with peeing (urination). Get help right away if:  You have a fever.  You are leaking fluid from your vagina.  You have spotting or bleeding from your vagina.  You have severe belly cramping or pain.  You lose or gain weight rapidly.  You have trouble catching your breath and have chest pain.  You notice sudden or extreme puffiness (swelling) of your face, hands, ankles, feet, or legs.  You have not felt the baby move in over an hour.  You have severe headaches that do not go away with medicine.  You have vision changes. This information is not intended to replace advice given to you by your health care provider. Make sure you discuss any questions you have with your health care   provider. Document Released: 07/09/2009 Document Revised: 09/20/2015 Document Reviewed: 06/15/2012 Elsevier Interactive Patient Education  2017 Elsevier Inc[employer]  

## 2016-10-07 NOTE — Progress Notes (Signed)
Breastfeeding tip reviewed 

## 2016-10-07 NOTE — Progress Notes (Signed)
   PRENATAL VISIT NOTE  Subjective:  [name] is a 26 y.o. G4P3003 at 563w0d being seen today for ongoing prenatal care.  She is currently monitored for the following issues for this low-risk pregnancy and has Depression with anxiety; Abdominal pain during pregnancy in first trimester; Supervision of normal first pregnancy, antepartum; and History of cesarean delivery, currently pregnant on her problem list.  Patient reports bilateral round lig pains.  Contractions: Not present. Vag. Bleeding: None.  Movement: Present. Denies leaking of fluid.   The following portions of the patient's history were reviewed and updated as appropriate: allergies, current medications, past family history, past medical history, past social history, past surgical history and problem list. Problem list updated.  Objective:   Vitals:   10/07/16 0804  BP: (!) 91/54  Pulse: 67  Weight: 195 lb 1.6 oz (88.5 kg)    Fetal Status: Fetal Heart Rate (bpm): 145   Movement: Present     General:  Alert, oriented and cooperative. Patient is in no acute distress.  Skin: Skin is warm and dry. No rash noted.   Cardiovascular: Normal heart rate noted  Respiratory: Normal respiratory effort, no problems with respiration noted  Abdomen: Soft, gravid, appropriate for gestational age. Pain/Pressure: Present     Pelvic:  tender over bilateral round ligaments      Extremities: Normal range of motion.  Edema: None  Mental Status: Normal mood and affect. Normal behavior. Normal judgment and thought content.   Assessment and Plan:  Pregnancy: G4P3003 at 463w0d  1. Supervision of normal intrauterine pregnancy in multigravida in second trimester        - AFP TETRA - US MFM OB DETAIL +14 WK; Future  2. H/O cesarean section     Reviewed pt's wish for C/S, states wants it planned and wants it so she can have a tubal at same time     Discussed could do VBAC with PP tubal, states she always goes overdue and doesn't want  to wait  - AFP TETRA - US MFM OB DETAIL +14 WK; Future  Preterm labor symptoms and general obstetric precautions including but not limited to vaginal bleeding, contractions, leaking of fluid and fetal movement were reviewed in detail with the patient. Please refer to After Visit Summary for other counseling recommendations.   RTC 4 weeks  Wynelle BourgeoisMarie Williams, CNM

## 2016-10-10 LAB — AFP TETRA
DIA Mom Value: 0.86
DIA VALUE (EIA): 121.75 pg/mL
DSR (BY AGE) 1 IN: 878
DSR (Second Trimester) 1 IN: 5396
Gestational Age: 17 WEEKS
MATERNAL AGE AT EDD: 27.7 a
MSAFP Mom: 0.68
MSAFP: 21.8 ng/mL
MSHCG Mom: 1.07
MSHCG: 28553 m[IU]/mL
OSB RISK: 10000
T18 (By Age): 1:3420 {titer}
TEST RESULTS AFP: NEGATIVE
Weight: 195 [lb_av]
uE3 Mom: 1.37
uE3 Value: 1.28 ng/mL

## 2016-10-24 ENCOUNTER — Encounter (HOSPITAL_COMMUNITY): Payer: Self-pay | Admitting: *Deleted

## 2016-10-28 ENCOUNTER — Other Ambulatory Visit: Payer: Self-pay | Admitting: Advanced Practice Midwife

## 2016-10-28 ENCOUNTER — Ambulatory Visit (HOSPITAL_COMMUNITY)
Admission: RE | Admit: 2016-10-28 | Discharge: 2016-10-28 | Disposition: A | Discharge status: Home / Self Care | Payer: Self-pay | Source: Ambulatory Visit | Attending: Advanced Practice Midwife | Admitting: Advanced Practice Midwife

## 2016-10-28 ENCOUNTER — Encounter (HOSPITAL_COMMUNITY): Payer: Self-pay

## 2016-10-28 ENCOUNTER — Other Ambulatory Visit (HOSPITAL_COMMUNITY): Payer: Self-pay | Admitting: *Deleted

## 2016-10-28 DIAGNOSIS — Z0489 Encounter for examination and observation for other specified reasons: Secondary | ICD-10-CM

## 2016-10-28 DIAGNOSIS — Z363 Encounter for antenatal screening for malformations: Secondary | ICD-10-CM | POA: Insufficient documentation

## 2016-10-28 DIAGNOSIS — O99212 Obesity complicating pregnancy, second trimester: Secondary | ICD-10-CM

## 2016-10-28 DIAGNOSIS — Z3482 Encounter for supervision of other normal pregnancy, second trimester: Secondary | ICD-10-CM

## 2016-10-28 DIAGNOSIS — O34219 Maternal care for unspecified type scar from previous cesarean delivery: Secondary | ICD-10-CM | POA: Insufficient documentation

## 2016-10-28 DIAGNOSIS — Z98891 History of uterine scar from previous surgery: Secondary | ICD-10-CM

## 2016-10-28 DIAGNOSIS — Z3A20 20 weeks gestation of pregnancy: Secondary | ICD-10-CM | POA: Insufficient documentation

## 2016-10-28 DIAGNOSIS — IMO0002 Reserved for concepts with insufficient information to code with codable children: Secondary | ICD-10-CM

## 2016-11-04 ENCOUNTER — Ambulatory Visit (INDEPENDENT_AMBULATORY_CARE_PROVIDER_SITE_OTHER): Payer: Self-pay | Admitting: Obstetrics and Gynecology

## 2016-11-04 DIAGNOSIS — Z3400 Encounter for supervision of normal first pregnancy, unspecified trimester: Secondary | ICD-10-CM

## 2016-11-04 DIAGNOSIS — O34212 Maternal care for vertical scar from previous cesarean delivery: Secondary | ICD-10-CM

## 2016-11-04 DIAGNOSIS — O34219 Maternal care for unspecified type scar from previous cesarean delivery: Secondary | ICD-10-CM

## 2016-11-04 DIAGNOSIS — Z3402 Encounter for supervision of normal first pregnancy, second trimester: Secondary | ICD-10-CM

## 2016-11-04 NOTE — Progress Notes (Signed)
   PRENATAL VISIT NOTE  Subjective:  Metro Kungdriana Ndiaye is a 26 y.o. G4P3003 at 5955w0d being seen today for ongoing prenatal care.  She is currently monitored for the following issues for this low-risk pregnancy and has Depression with anxiety; Abdominal pain during pregnancy in first trimester; Supervision of normal first pregnancy, antepartum; and History of cesarean delivery, currently pregnant on her problem list.  Patient reports no complaints.  Contractions: Not present. Vag. Bleeding: None.  Movement: Present. Denies leaking of fluid.   The following portions of the patient's history were reviewed and updated as appropriate: allergies, current medications, past family history, past medical history, past social history, past surgical history and problem list. Problem list updated.  Objective:   Vitals:   11/04/16 1019  BP: (!) 100/45  Pulse: 80  Weight: 192 lb 3.2 oz (87.2 kg)    Fetal Status: Fetal Heart Rate (bpm): 148 Fundal Height: 22 cm Movement: Present     General:  Alert, oriented and cooperative. Patient is in no acute distress.  Skin: Skin is warm and dry. No rash noted.   Cardiovascular: Normal heart rate noted  Respiratory: Normal respiratory effort, no problems with respiration noted  Abdomen: Soft, gravid, appropriate for gestational age. Pain/Pressure: Present     Pelvic:  Cervical exam deferred        Extremities: Normal range of motion.  Edema: None  Mental Status: Normal mood and affect. Normal behavior. Normal judgment and thought content.   Assessment and Plan:  Pregnancy: G4P3003 at 3655w0d  1. History of cesarean delivery, currently pregnant - Desires repeat.  - Desires BTL however patient is self pay.   2. Supervision of normal first pregnancy, antepartum  Follow US scheduled: Follow-up ultrasound in 6 weeks to complete anatomy   Preterm labor symptoms and general obstetric precautions including but not limited to vaginal bleeding, contractions,  leaking of fluid and fetal movement were reviewed in detail with the patient. Please refer to After Visit Summary for other counseling recommendations.  Return in about 4 weeks (around 12/02/2016).  Pacen Watford, Harolyn RutherfordJennifer I, NP

## 2016-12-03 ENCOUNTER — Encounter: Payer: Self-pay | Admitting: Advanced Practice Midwife

## 2016-12-09 ENCOUNTER — Encounter (HOSPITAL_COMMUNITY): Payer: Self-pay

## 2016-12-09 ENCOUNTER — Ambulatory Visit (HOSPITAL_COMMUNITY)
Admission: RE | Admit: 2016-12-09 | Discharge: 2016-12-09 | Disposition: A | Discharge status: Home / Self Care | Payer: Self-pay | Source: Ambulatory Visit | Attending: Advanced Practice Midwife | Admitting: Advanced Practice Midwife

## 2016-12-09 DIAGNOSIS — Z0489 Encounter for examination and observation for other specified reasons: Secondary | ICD-10-CM

## 2016-12-09 DIAGNOSIS — O09892 Supervision of other high risk pregnancies, second trimester: Secondary | ICD-10-CM | POA: Insufficient documentation

## 2016-12-09 DIAGNOSIS — O99212 Obesity complicating pregnancy, second trimester: Secondary | ICD-10-CM | POA: Insufficient documentation

## 2016-12-09 DIAGNOSIS — IMO0002 Reserved for concepts with insufficient information to code with codable children: Secondary | ICD-10-CM

## 2016-12-09 DIAGNOSIS — Z048 Encounter for examination and observation for other specified reasons: Secondary | ICD-10-CM | POA: Insufficient documentation

## 2016-12-09 DIAGNOSIS — E669 Obesity, unspecified: Secondary | ICD-10-CM | POA: Insufficient documentation

## 2016-12-09 DIAGNOSIS — Z3A26 26 weeks gestation of pregnancy: Secondary | ICD-10-CM | POA: Insufficient documentation

## 2016-12-09 DIAGNOSIS — O34219 Maternal care for unspecified type scar from previous cesarean delivery: Secondary | ICD-10-CM | POA: Insufficient documentation

## 2016-12-09 DIAGNOSIS — Z6835 Body mass index (BMI) 35.0-35.9, adult: Secondary | ICD-10-CM | POA: Insufficient documentation

## 2016-12-09 NOTE — Addendum Note (Signed)
Encounter addended by: Aundra MilletKiser, Drue Harr E on: 12/09/2016 10:04 AM<BR>    Actions taken: Imaging Exam ended

## 2016-12-17 ENCOUNTER — Ambulatory Visit (INDEPENDENT_AMBULATORY_CARE_PROVIDER_SITE_OTHER): Payer: Self-pay | Admitting: Advanced Practice Midwife

## 2016-12-17 VITALS — BP 85/43 | HR 89 | Wt 193.9 lb

## 2016-12-17 DIAGNOSIS — O34219 Maternal care for unspecified type scar from previous cesarean delivery: Secondary | ICD-10-CM

## 2016-12-17 DIAGNOSIS — O219 Vomiting of pregnancy, unspecified: Secondary | ICD-10-CM

## 2016-12-17 DIAGNOSIS — G4709 Other insomnia: Secondary | ICD-10-CM

## 2016-12-17 DIAGNOSIS — R42 Dizziness and giddiness: Secondary | ICD-10-CM

## 2016-12-17 DIAGNOSIS — O26893 Other specified pregnancy related conditions, third trimester: Secondary | ICD-10-CM

## 2016-12-17 DIAGNOSIS — O99613 Diseases of the digestive system complicating pregnancy, third trimester: Secondary | ICD-10-CM

## 2016-12-17 DIAGNOSIS — R51 Headache: Secondary | ICD-10-CM

## 2016-12-17 DIAGNOSIS — R519 Headache, unspecified: Secondary | ICD-10-CM

## 2016-12-17 DIAGNOSIS — K5900 Constipation, unspecified: Secondary | ICD-10-CM

## 2016-12-17 MED ORDER — METOCLOPRAMIDE HCL 10 MG PO TABS: 10.0000 mg | ORAL_TABLET | Freq: Three times a day (TID) | ORAL | 1 refills | Status: AC

## 2016-12-17 NOTE — Progress Notes (Signed)
Patient c/o frequent headaches, sometimes helped by tylenol sometimes not. Also having edema of hands and feet at times.

## 2016-12-17 NOTE — Progress Notes (Signed)
   PRENATAL VISIT NOTE  Subjective:  [name] is a 26 y.o. G4P3003 at [redacted]w[redacted]d being seen today for ongoing prenatal care.  She is currently monitored for the following issues for this low-risk pregnancy and has Depression with anxiety; Abdominal pain during pregnancy in first trimester; Supervision of other normal pregnancy, antepartum; and History of cesarean delivery, currently pregnant on her problem list.  Patient reports headache, vomiting and dizziness daily.  Contractions: Irritability. Vag. Bleeding: None.  Movement: Present. Denies leaking of fluid.   The following portions of the patient's history were reviewed and updated as appropriate: allergies, current medications, past family history, past medical history, past social history, past surgical history and problem list. Problem list updated.  Objective:   Vitals:   12/17/16 1620  BP: (!) 85/43  Pulse: 89  Weight: 193 lb 14.4 oz (88 kg)    Fetal Status: Fetal Heart Rate (bpm): 140   Movement: Present     General:  Alert, oriented and cooperative. Patient is in no acute distress.  Skin: Skin is warm and dry. No rash noted.   Cardiovascular: Normal heart rate noted  Respiratory: Normal respiratory effort, no problems with respiration noted  Abdomen: Soft, gravid, appropriate for gestational age.  Pain/Pressure: Present     Pelvic: Cervical exam deferred        Extremities: Normal range of motion.  Edema: None  Mental Status:  Normal mood and affect. Normal behavior. Normal judgment and thought content.   Assessment and Plan:  Pregnancy: G4P3003 at [redacted]w[redacted]d  1. History of cesarean delivery, currently pregnant --Desires repeat  2. Dizziness --Postural daily and occasional dizziness while at rest.   --Hypotension today with BP 85/43 --One episode of palpitations/tachycardia last week, none since --No cardiac history --Hgb 14 at initial OB visit - CBC --Increase PO fluids, see below for management of n/v and  to increase fluids and meals.  3. Headache in pregnancy, antepartum, third trimester --3-4 x /week --Increase PO fluids, Tylenol PRN, Reglan Rx for nausea may help  4. Nausea and vomiting during pregnancy --Reports the position of the baby (breech) has been uncomfortable and fetal movements after eating cause her to vomit. She is not eating or drinking enough daily. --Reglan 10 mg TID PRN.   --Small frequent meals, high protein.  5.  Insomnia in pregnancy --Try Benadryl PRN at night  6.  Constipation --Colace BID PRN --High fiber diet, increase PO fluids    Preterm labor symptoms and general obstetric precautions including but not limited to vaginal bleeding, contractions, leaking of fluid and fetal movement were reviewed in detail with the patient. Please refer to After Visit Summary for other counseling recommendations.  No Follow-up on file.   Sharen Counter, CNM

## 2016-12-18 LAB — CBC
Hematocrit: 35.1 % (ref 34.0–46.6)
Hemoglobin: 11.9 g/dL (ref 11.1–15.9)
MCH: 30.8 pg (ref 26.6–33.0)
MCHC: 33.9 g/dL (ref 31.5–35.7)
MCV: 91 fL (ref 79–97)
PLATELETS: 240 10*3/uL (ref 150–379)
RBC: 3.86 x10E6/uL (ref 3.77–5.28)
RDW: 13.2 % (ref 12.3–15.4)
WBC: 9.3 10*3/uL (ref 3.4–10.8)

## 2016-12-24 ENCOUNTER — Other Ambulatory Visit: Payer: Self-pay

## 2016-12-24 DIAGNOSIS — Z3480 Encounter for supervision of other normal pregnancy, unspecified trimester: Secondary | ICD-10-CM

## 2016-12-25 LAB — CBC
HEMOGLOBIN: 11.2 g/dL (ref 11.1–15.9)
Hematocrit: 33.7 % — ABNORMAL LOW (ref 34.0–46.6)
MCH: 29.8 pg (ref 26.6–33.0)
MCHC: 33.2 g/dL (ref 31.5–35.7)
MCV: 90 fL (ref 79–97)
Platelets: 246 10*3/uL (ref 150–379)
RBC: 3.76 x10E6/uL — ABNORMAL LOW (ref 3.77–5.28)
RDW: 13.4 % (ref 12.3–15.4)
WBC: 9.2 10*3/uL (ref 3.4–10.8)

## 2016-12-25 LAB — GLUCOSE TOLERANCE, 2 HOURS W/ 1HR
GLUCOSE, FASTING: 77 mg/dL (ref 65–91)
Glucose, 1 hour: 104 mg/dL (ref 65–179)
Glucose, 2 hour: 107 mg/dL (ref 65–152)

## 2016-12-25 LAB — RPR: RPR: NONREACTIVE

## 2016-12-25 LAB — HIV ANTIBODY (ROUTINE TESTING W REFLEX): HIV SCREEN 4TH GENERATION: NONREACTIVE

## 2017-01-01 ENCOUNTER — Ambulatory Visit (INDEPENDENT_AMBULATORY_CARE_PROVIDER_SITE_OTHER): Payer: Self-pay | Admitting: Obstetrics and Gynecology

## 2017-01-01 VITALS — BP 85/48 | HR 96 | Wt 193.5 lb

## 2017-01-01 DIAGNOSIS — O34219 Maternal care for unspecified type scar from previous cesarean delivery: Secondary | ICD-10-CM

## 2017-01-01 DIAGNOSIS — Z3483 Encounter for supervision of other normal pregnancy, third trimester: Secondary | ICD-10-CM

## 2017-01-01 DIAGNOSIS — Z23 Encounter for immunization: Secondary | ICD-10-CM

## 2017-01-01 DIAGNOSIS — Z3480 Encounter for supervision of other normal pregnancy, unspecified trimester: Secondary | ICD-10-CM

## 2017-01-01 NOTE — Addendum Note (Signed)
Addended by: Lorelle GibbsWILSON, CHIQUITA L on: 01/01/2017 09:49 AM   Modules accepted: Orders

## 2017-01-01 NOTE — Patient Instructions (Signed)
Lo que debe saber sobre la esterilizacin en las mujeres (What You Need to Know About Female Sterilization) La esterilizacin en la mujer es una ciruga que se realiza para evitar el embarazo. En esta ciruga, las trompas de Falopio se obstruyen o se cierran. Esto evita que los vulos lleguen al tero y que los fecunde un espermatozoide; en consecuencia, se previene el embarazo. La esterilizacin es permanente. Solo debe hacerse si est segura de que no desea tener hijos. CULES SON LAS OPCIONES QUIRRGICAS PARA LA ESTERILIZACIN? Hay varias clases de cirugas para esterilizacin en la mujer. Estos incluyen los siguientes:  Ligadura laparoscpica de trompas. En esta ciruga, las trompas de Falopio se atan, se sellan con calor o se obstruyen con un clip, un anillo o una abrazadera. Tambin puede extraerse una parte pequea de cada trompa de Falopio. Esta ciruga se realiza a travs de varios cortes (incisiones) pequeos.  Ligadura de trompas posparto. Esto tambin se denomina minilaparotoma. La ciruga se realiza de inmediato despus del nacimiento, o 1o 2das despus del nacimiento. En esta ciruga, las trompas de Falopio se atan, se sellan con calor o se obstruyen con un clip, un anillo o una abrazadera. Tambin puede extraerse una parte pequea de cada trompa de Falopio. La ciruga se realiza a travs de una sola incisin.  Esterilizacin histeroscpica. En esta ciruga, se coloca un diminuto resorte helicoidal a travs del cuello uterino y el tero hasta las trompas de Falopio. El resorte produce cicatrizacin patolgica, lo que obstruye las trompas. Despus de la ciruga, se deben utilizar anticonceptivos durante 3meses para permitir que el tejido cicatricial se forme por completo. LA ESTERILIZACIN ES UN PROCEDIMIENTO SEGURO? En general, la esterilizacin es segura. Las complicaciones son raras. No obstante, hay algunos riesgos. Estos incluyen los  siguientes:  Hemorragia.  Infeccin.  Reaccin al medicamento utilizado durante el procedimiento.  Lesin en los rganos circundantes.  Fallas en el procedimiento. LA ESTERILIZACIN ES UN MTODO EFECTIVO? La esterilizacin es efectiva en casi 100%, pero puede fallar. Adems, con el tiempo, las trompas de Falopio pueden volver a desarrollarse. Si esto sucede, podr quedar embarazada nuevamente. Los mujeres que se realizan este procedimiento tienen ms probabilidades de sufrir un embarazo ectpico. El embarazo ectpico es aquel que se produce fuera del tero. Esta clase de embarazo no tiene resultados satisfactorios y puede causar sangrado grave si no se trata. CULES SON LOS BENEFICIOS?  En general, es efectiva para toda la vida.  Suele ser segura.  No presenta los inconvenientes de otros tipos de mtodos anticonceptivos: Esto significa lo siguiente: ? No afecta a las hormonas. Por lo tanto, no influye en los perodos menstruales, en el deseo ni en el rendimiento sexual. ? No tiene efectos secundarios. CULES SON LOS INCONVENIENTES?  Si cambia de parecer y decide que quiere tener hijos, no podr hacerlo. La esterilizacin puede revertirse, pero esto no siempre es exitoso.  No ofrece proteccin contra las ETS (enfermedades de transmisin sexual).  Aumenta la probabilidad de tener un embarazo ectpico. Esta informacin no tiene como fin reemplazar el consejo del mdico. Asegrese de hacerle al mdico cualquier pregunta que tenga. Document Released: 10/01/2007 Document Revised: 08/06/2015 Document Reviewed: 01/09/2015 Elsevier Interactive Patient Education  2018 [employer] Inc.  

## 2017-01-01 NOTE — Progress Notes (Signed)
Subjective:  [name] is a 26 y.o. G4P3003 at 8120w2d being seen today for ongoing prenatal care.  She is currently monitored for the following issues for this low-risk pregnancy and has Depression with anxiety; Abdominal pain during pregnancy in first trimester; Supervision of other normal pregnancy, antepartum; and History of cesarean delivery, currently pregnant on her problem list.  Patient reports no complaints.  Contractions: Not present. Vag. Bleeding: None.  Movement: Present. Denies leaking of fluid.   The following portions of the patient's history were reviewed and updated as appropriate: allergies, current medications, past family history, past medical history, past social history, past surgical history and problem list. Problem list updated.  Objective:   Vitals:   01/01/17 0903  BP: (!) 85/48  Pulse: 96  Weight: 193 lb 8 oz (87.8 kg)    Fetal Status: Fetal Heart Rate (bpm): 141 Fundal Height: 31 cm Movement: Present     General:  Alert, oriented and cooperative. Patient is in no acute distress.  Skin: Skin is warm and dry. No rash noted.   Cardiovascular: Normal heart rate noted  Respiratory: Normal respiratory effort, no problems with respiration noted  Abdomen: Soft, gravid, appropriate for gestational age. Pain/Pressure: Present     Pelvic: Vag. Bleeding: None     Cervical exam deferred        Extremities: Normal range of motion.  Edema: None  Mental Status: Normal mood and affect. Normal behavior. Normal judgment and thought content.   Urinalysis:      Assessment and Plan:  Pregnancy: G4P3003 at 8020w2d  1. Supervision of other normal pregnancy, antepartum Continue routine prenatal care for low-risk pregnancy. Reviewed 28wk labs and were all normal. Tdap given today.   2. History of cesarean delivery, currently pregnant Desires repeat. Staff message sent to schedule surgery. Also requires BTL with c/s. Patient will be self-pay. Encouraged to pay prior  to procedure (~$900).   Preterm labor symptoms and general obstetric precautions including but not limited to vaginal bleeding, contractions, leaking of fluid and fetal movement were reviewed in detail with the patient. Please refer to After Visit Summary for other counseling recommendations.  Return in about 2 weeks (around 01/15/2017) for ob visit.   Caryl AdaJazma Amjad Fikes, DO [facility], [facility] [place] 01/01/2017, 9:31 AM

## 2017-01-05 ENCOUNTER — Encounter (HOSPITAL_COMMUNITY): Payer: Self-pay

## 2017-01-15 ENCOUNTER — Ambulatory Visit (INDEPENDENT_AMBULATORY_CARE_PROVIDER_SITE_OTHER): Payer: Self-pay | Admitting: Advanced Practice Midwife

## 2017-01-15 ENCOUNTER — Other Ambulatory Visit: Payer: Self-pay | Admitting: Family Medicine

## 2017-01-15 VITALS — BP 121/61 | HR 95 | Wt 194.0 lb

## 2017-01-15 DIAGNOSIS — Z3480 Encounter for supervision of other normal pregnancy, unspecified trimester: Secondary | ICD-10-CM

## 2017-01-15 DIAGNOSIS — Z3483 Encounter for supervision of other normal pregnancy, third trimester: Secondary | ICD-10-CM

## 2017-01-15 DIAGNOSIS — Z23 Encounter for immunization: Secondary | ICD-10-CM

## 2017-01-15 NOTE — Patient Instructions (Signed)
El Psychiatrist y la gripe (Pregnancy and Influenza) La gripe es una infeccin del tracto respiratorio. Si est embarazada, tiene ms probabilidades de contagiarse de gripe. Tambin tiene ms probabilidades de que esta enfermedad sea ms grave. Esto se debe a que Naval architect capacidad del cuerpo de combatir las infecciones (debilita el sistema inmunitario). Adems, ejerce un esfuerzo adicional en el corazn y los pulmones, lo que aumenta las probabilidades de tener complicaciones. Si tiene un caso grave de gripe, en especial con fiebre alta, puede ser peligroso para el beb en desarrollo. Puede provocar un Aleen Campi de parto prematuro. CMO SE CONTAGIAN DE GRIPE LAS PERSONAS? La gripe es causada por el virus de la influenza, que es ms frecuente en otoo e invierno. Se propaga cuando las partculas del virus se transmiten de persona a Social worker. Se puede contagiar de gripe si est cerca de una persona enferma que tose o estornuda. Tambin se puede contagiar si toca algo en lo que est el virus y luego se toca la cara. CMO ME PUEDO PROTEGER CONTRA LA GRIPE?  Vacnese contra la gripe. La mejor [employer] de prevenir esta enfermedad es vacunarse antes de que comience la temporada de gripe. Esto no es peligroso para el beb en desarrollo. Incluso puede ayudar a protegerlo de la gripe hasta 6 meses despus del nacimiento. La inyeccin contra la gripe es un tipo de vacuna contra esta enfermedad. Otro tipo es la vacuna en forma de aerosol nasal. No se administre la vacuna en forma de aerosol nasal. No est aprobada para el embarazo.  No entre en contacto directo con personas enfermas.  No comparta alimentos, bebidas ni utensilios con [employer].  Lvese las manos con frecuencia. Utilice un desinfectante para manos si no dispone de [place] y [place].  QU DEBO HACER SI TENGO SNTOMAS DE GRIPE? Si tiene sntomas de gripe, llame al mdico de inmediato. Los sntomas son:  Grant Ruts o escalofros.  Dolores  musculares.  Dolor de [place].  Dolor de Advertising copywriter.  Congestin nasal.  Tos.  Cansancio.  Prdida del apetito.  Vmitos.  Diarrea. Posiblemente pueda tomar un medicamento antiviral para evitar que la gripe se vuelva grave y para disminuir su duracin. QU DEBO HACER EN EL HOGAR SI ME DIAGNOSTICAN GRIPE?  No tome ningn medicamento, como medicamentos para el resfro o la gripe, a menos que el mdico se lo haya indicado.  Si toma medicamentos antivirales, asegrese de terminarlos, aunque comience a sentirse mejor.  Beba suficiente lquido para Photographer orina clara o de color amarillo plido.  Descanse lo suficiente.  CUNDO DEBO BUSCAR ATENCIN MDICA DE INMEDIATO SI TENGO GRIPE?  Tiene dificultad para respirar.  Siente dolor en el pecho.  Comienza a Engineer, building services de [address].  Tiene fiebre alta que no baja despus de tomar medicamentos.  No siente los movimientos del beb.  Tiene diarrea o vmitos que no desaparecen.  Esta informacin no tiene Theme park manager el consejo del mdico. Asegrese de hacerle al mdico cualquier pregunta que tenga. Document Released: 07/11/2008 Document Revised: 04/19/2013 Document Reviewed: 03/11/2013 Elsevier Interactive Patient Education  2017 [employer].

## 2017-01-15 NOTE — Progress Notes (Signed)
   PRENATAL VISIT NOTE  Subjective:  [name] is a 26 y.o. G4P3003 at [redacted]w[redacted]d being seen today for ongoing prenatal care.  She is currently monitored for the following issues for this low-risk pregnancy and has Depression with anxiety; Abdominal pain during pregnancy in first trimester; Supervision of other normal pregnancy, antepartum; and History of cesarean delivery, currently pregnant on her problem list.  Patient reports no complaints.  Contractions: Not present. Vag. Bleeding: None.  Movement: Present. Denies leaking of fluid.   The following portions of the patient's history were reviewed and updated as appropriate: allergies, current medications, past family history, past medical history, past social history, past surgical history and problem list. Problem list updated.  Objective:   Vitals:   01/15/17 1146  BP: 121/61  Pulse: 95  Weight: 194 lb (88 kg)    Fetal Status: Fetal Heart Rate (bpm): 140 Fundal Height: 33 cm Movement: Present  Presentation: Homero Fellers Breech  General:  Alert, oriented and cooperative. Patient is in no acute distress.  Skin: Skin is warm and dry. No rash noted.   Cardiovascular: Normal heart rate noted  Respiratory: Normal respiratory effort, no problems with respiration noted  Abdomen: Soft, gravid, appropriate for gestational age.  Pain/Pressure: Present     Pelvic: Cervical exam deferred        Extremities: Normal range of motion.  Edema: None  Mental Status:  Normal mood and affect. Normal behavior. Normal judgment and thought content.   Assessment and Plan:  Pregnancy: G4P3003 at [redacted]w[redacted]d  1. Need for immunization against influenza  - Flu Vaccine QUAD 6+ mos IM (Fluarix)  Preterm labor symptoms and general obstetric precautions including but not limited to vaginal bleeding, contractions, leaking of fluid and fetal movement were reviewed in detail with the patient. Please refer to After Visit Summary for other counseling recommendations.   F/U 2 weeks   Dorathy Kinsman, [place]

## 2017-01-15 NOTE — Progress Notes (Signed)
Flu vaccine today 

## 2017-01-29 ENCOUNTER — Inpatient Hospital Stay (HOSPITAL_COMMUNITY)
Admission: AD | Admit: 2017-01-29 | Discharge: 2017-01-29 | Disposition: A | Discharge status: Home / Self Care | Payer: Self-pay | Source: Ambulatory Visit | Attending: Obstetrics & Gynecology | Admitting: Obstetrics & Gynecology

## 2017-01-29 ENCOUNTER — Encounter (HOSPITAL_COMMUNITY): Payer: Self-pay

## 2017-01-29 ENCOUNTER — Encounter: Payer: Self-pay | Admitting: Student

## 2017-01-29 DIAGNOSIS — O23593 Infection of other part of genital tract in pregnancy, third trimester: Secondary | ICD-10-CM | POA: Insufficient documentation

## 2017-01-29 DIAGNOSIS — N760 Acute vaginitis: Secondary | ICD-10-CM

## 2017-01-29 DIAGNOSIS — B9689 Other specified bacterial agents as the cause of diseases classified elsewhere: Secondary | ICD-10-CM | POA: Insufficient documentation

## 2017-01-29 DIAGNOSIS — Z3A33 33 weeks gestation of pregnancy: Secondary | ICD-10-CM | POA: Insufficient documentation

## 2017-01-29 DIAGNOSIS — O4703 False labor before 37 completed weeks of gestation, third trimester: Secondary | ICD-10-CM

## 2017-01-29 LAB — FETAL FIBRONECTIN: Fetal Fibronectin: NEGATIVE

## 2017-01-29 LAB — URINALYSIS, ROUTINE W REFLEX MICROSCOPIC
Bilirubin Urine: NEGATIVE
GLUCOSE, UA: NEGATIVE mg/dL
HGB URINE DIPSTICK: NEGATIVE
Ketones, ur: 20 mg/dL — AB
NITRITE: NEGATIVE
PH: 7 (ref 5.0–8.0)
Protein, ur: NEGATIVE mg/dL
Specific Gravity, Urine: 1.013 (ref 1.005–1.030)

## 2017-01-29 LAB — WET PREP, GENITAL
SPERM: NONE SEEN
TRICH WET PREP: NONE SEEN
Yeast Wet Prep HPF POC: NONE SEEN

## 2017-01-29 LAB — GLUCOSE, CAPILLARY: Glucose-Capillary: 72 mg/dL (ref 65–99)

## 2017-01-29 MED ORDER — NIFEDIPINE 10 MG PO CAPS
10.0000 mg | ORAL_CAPSULE | ORAL | Status: AC
  Administered 2017-01-29 (×2): 10 mg via ORAL
  Filled 2017-01-29 (×2): qty 1

## 2017-01-29 MED ORDER — METRONIDAZOLE 0.75 % VA GEL: 1.0000 | Freq: Every day | VAGINAL | 0 refills | Status: AC

## 2017-01-29 NOTE — MAU Note (Signed)
+  contractions Started this morning Painful in back Rating pain 8/10  Denies vaginal bleeding or LOF.  +FM

## 2017-01-29 NOTE — MAU Provider Note (Signed)
History     CSN: [id number]  Arrival date and time: 01/29/17 1355   First Provider Initiated Contact with Patient 01/29/17 1441      Chief Complaint  Patient presents with  . Contractions   HPI  [name] is a 26 y.o. [id number] at [redacted]w[redacted]d gestation presenting to MAU with complaints of "being in labor". She reports contractions that started at 0600 this AM.  She states the contractions started out sporatic, but are more painful and frequent now.  She also complains of abdominal pain that feels "like she needs to have a diarrhea BM".  Denies VB or LOF.   Past Medical History:  Diagnosis Date  . Asthma   . Gall stones   . Seasonal allergies     Past Surgical History:  Procedure Laterality Date  . CESAREAN SECTION     C/S x 1  . CHOLECYSTECTOMY    . CHOLECYSTECTOMY      Family History  Problem Relation Age of Onset  . Diabetes Maternal Grandfather     Social History  Substance Use Topics  . Smoking status: Never Smoker  . Smokeless tobacco: Never Used  . Alcohol use No    Allergies:  Allergies  Allergen Reactions  . Avocado Anaphylaxis  . Norco [Hydrocodone-Acetaminophen] Nausea Only    dizziness  . Strawberry Extract Hives, Shortness Of Breath and Swelling  . Fentanyl-Bupivacaine-Nacl Rash    Pt states epideral caused rash all over body    Prescriptions Prior to Admission  Medication Sig Dispense Refill Last Dose  . acetaminophen (TYLENOL) 500 MG tablet Take 1,000 mg by mouth every 6 (six) hours as needed for mild pain, moderate pain or headache.   Past Month at Unknown time  . albuterol (PROVENTIL HFA;VENTOLIN HFA) 108 (90 BASE) MCG/ACT inhaler Inhale 2 puffs into the lungs every 6 (six) hours as needed for wheezing or shortness of breath.   Past Month at Unknown time  . metoCLOPramide (REGLAN) 10 MG tablet Take 1 tablet (10 mg total) by mouth 3 (three) times daily before meals. 90 tablet 1 Past Month at Unknown time  . Prenatal Vit-Fe  Fumarate-FA (PRENATAL MULTIVITAMIN) TABS tablet Take 1 tablet by mouth daily at 12 noon.   01/29/2017 at Unknown time  . promethazine (PHENERGAN) 25 MG tablet Take 1 tablet (25 mg total) by mouth every 6 (six) hours as needed for nausea or vomiting. 30 tablet 2 Past Month at Unknown time    Review of Systems  Constitutional: Negative.   HENT: Negative.   Eyes: Negative.   Respiratory: Negative.   Cardiovascular: Negative.   Gastrointestinal: Positive for abdominal pain (contractions since 0600; now stronger).  Endocrine: Negative.   Genitourinary: Negative.   Musculoskeletal: Negative.   Skin: Negative.   Allergic/Immunologic: Negative.   Neurological: Negative.   Hematological: Negative.   Psychiatric/Behavioral: Negative.    Physical Exam   Blood pressure 97/64, pulse (!) 103, temperature 98.1 F (36.7 C), temperature source Oral, resp. rate 18, height  (1.575 m), weight 88.5 kg (195 lb 1.9 oz), last menstrual period 06/10/2016, SpO2 100 %.  Physical Exam  Nursing note and vitals reviewed. Constitutional: She is oriented to person, place, and time. She appears well-developed and well-nourished.  HENT:  Head: Normocephalic.  Eyes: Pupils are equal, round, and reactive to light.  Neck: Normal range of motion.  Cardiovascular: Normal rate, regular rhythm and normal heart sounds.   Respiratory: Effort normal and breath sounds normal.  GI: Soft. Bowel sounds  are normal.  Genitourinary:  Genitourinary Comments: Uterus: gravid, S=D, cx: smooth, pink, no lesions, small amt of thick, white vaginal d/c, closed/long/very soft -- small amt of BRB noted on exam glove, no CMT, no adnexal tenderness  Musculoskeletal: Normal range of motion.  Neurological: She is alert and oriented to person, place, and time.  Skin: Skin is warm and dry.  Psychiatric: She has a normal mood and affect. Her behavior is normal. Judgment and thought content normal.   Dilation: Closed Effacement (%):  50 Station: -3 Exam by:: Carloyn Jaeger CNM  MAU Course  Procedures  MDM CCUA Wet Prep GC/CT fFN Procardia 10 mg every 20 mins x 3 doses -- only given 2 doses d/t lower BP -- improved UC's  NST - FHR: 145 bpm / moderate variability / accels present / decels absent / TOCO: regular every 3-4 mins --> only UI noted prior to d/c home  Results for orders placed or performed during the hospital encounter of 01/29/17 (from the past 24 hour(s))  Urinalysis, Routine w reflex microscopic     Status: Abnormal   Collection Time: 01/29/17  2:08 PM  Result Value Ref Range   Color, Urine YELLOW YELLOW   APPearance HAZY (A) CLEAR   Specific Gravity, Urine 1.013 1.005 - 1.030   pH 7.0 5.0 - 8.0   Glucose, UA NEGATIVE NEGATIVE mg/dL   Hgb urine dipstick NEGATIVE NEGATIVE   Bilirubin Urine NEGATIVE NEGATIVE   Ketones, ur 20 (A) NEGATIVE mg/dL   Protein, ur NEGATIVE NEGATIVE mg/dL   Nitrite NEGATIVE NEGATIVE   Leukocytes, UA SMALL (A) NEGATIVE   RBC / HPF 0-5 0 - 5 RBC/hpf   WBC, UA 0-5 0 - 5 WBC/hpf   Bacteria, UA FEW (A) NONE SEEN   Squamous Epithelial / LPF 0-5 (A) NONE SEEN   Mucus PRESENT   Wet prep, genital     Status: Abnormal   Collection Time: 01/29/17  2:50 PM  Result Value Ref Range   Yeast Wet Prep HPF POC NONE SEEN NONE SEEN   Trich, Wet Prep NONE SEEN NONE SEEN   Clue Cells Wet Prep HPF POC PRESENT (A) NONE SEEN   WBC, Wet Prep HPF POC MANY (A) NONE SEEN   Sperm NONE SEEN   Fetal fibronectin     Status: None   Collection Time: 01/29/17  2:50 PM  Result Value Ref Range   Fetal Fibronectin NEGATIVE NEGATIVE  Glucose, capillary     Status: None   Collection Time: 01/29/17  4:51 PM  Result Value Ref Range   Glucose-Capillary 72 65 - 99 mg/dL    Assessment and Plan  Preterm uterine contractions in third trimester, antepartum - Advised to return to MAU for 6 or more UC's - Advised to drink plenty of water daily - Information on BH ctxs given  Bacterial vaginitis - Rx for  Metrogel 0.75% in vagina hs x 5 days - Information on BV & Metrogel given  Discharge home Patient verbalized an understanding of the plan of care and agrees.   Raelyn Mora, MSN, CNM 01/29/2017, 2:41 PM

## 2017-01-30 ENCOUNTER — Encounter (HOSPITAL_COMMUNITY): Payer: Self-pay | Admitting: *Deleted

## 2017-01-30 ENCOUNTER — Inpatient Hospital Stay (HOSPITAL_COMMUNITY)
Admission: AD | Admit: 2017-01-30 | Discharge: 2017-02-01 | DRG: 818 | Disposition: A | Discharge status: Home / Self Care | Payer: Medicaid Other | Source: Ambulatory Visit | Attending: Surgery | Admitting: Surgery

## 2017-01-30 ENCOUNTER — Encounter (HOSPITAL_COMMUNITY): Admission: AD | Disposition: A | Discharge status: Home / Self Care | Payer: Self-pay | Source: Ambulatory Visit

## 2017-01-30 ENCOUNTER — Inpatient Hospital Stay (HOSPITAL_COMMUNITY): Payer: Medicaid Other

## 2017-01-30 ENCOUNTER — Inpatient Hospital Stay (HOSPITAL_COMMUNITY): Payer: Medicaid Other | Admitting: Anesthesiology

## 2017-01-30 DIAGNOSIS — R109 Unspecified abdominal pain: Secondary | ICD-10-CM

## 2017-01-30 DIAGNOSIS — R509 Fever, unspecified: Secondary | ICD-10-CM

## 2017-01-30 DIAGNOSIS — O4703 False labor before 37 completed weeks of gestation, third trimester: Secondary | ICD-10-CM | POA: Diagnosis present

## 2017-01-30 DIAGNOSIS — Z3A33 33 weeks gestation of pregnancy: Secondary | ICD-10-CM

## 2017-01-30 DIAGNOSIS — K3580 Unspecified acute appendicitis: Secondary | ICD-10-CM | POA: Diagnosis present

## 2017-01-30 DIAGNOSIS — O99613 Diseases of the digestive system complicating pregnancy, third trimester: Secondary | ICD-10-CM | POA: Diagnosis present

## 2017-01-30 DIAGNOSIS — O26899 Other specified pregnancy related conditions, unspecified trimester: Secondary | ICD-10-CM

## 2017-01-30 DIAGNOSIS — O99619 Diseases of the digestive system complicating pregnancy, unspecified trimester: Secondary | ICD-10-CM

## 2017-01-30 DIAGNOSIS — Z3480 Encounter for supervision of other normal pregnancy, unspecified trimester: Secondary | ICD-10-CM

## 2017-01-30 DIAGNOSIS — Z9049 Acquired absence of other specified parts of digestive tract: Secondary | ICD-10-CM

## 2017-01-30 HISTORY — PX: APPENDECTOMY: SHX54

## 2017-01-30 LAB — BASIC METABOLIC PANEL
ANION GAP: 11 (ref 5–15)
BUN: 5 mg/dL — ABNORMAL LOW (ref 6–20)
CALCIUM: 8.6 mg/dL — AB (ref 8.9–10.3)
CO2: 17 mmol/L — ABNORMAL LOW (ref 22–32)
Chloride: 106 mmol/L (ref 101–111)
Creatinine, Ser: 0.34 mg/dL — ABNORMAL LOW (ref 0.44–1.00)
GLUCOSE: 93 mg/dL (ref 65–99)
POTASSIUM: 3.5 mmol/L (ref 3.5–5.1)
Sodium: 134 mmol/L — ABNORMAL LOW (ref 135–145)

## 2017-01-30 LAB — URINALYSIS, ROUTINE W REFLEX MICROSCOPIC
BILIRUBIN URINE: NEGATIVE
Glucose, UA: NEGATIVE mg/dL
HGB URINE DIPSTICK: NEGATIVE
Ketones, ur: 80 mg/dL — AB
Leukocytes, UA: NEGATIVE
NITRITE: NEGATIVE
PH: 6 (ref 5.0–8.0)
Protein, ur: NEGATIVE mg/dL
SPECIFIC GRAVITY, URINE: 1.017 (ref 1.005–1.030)

## 2017-01-30 LAB — GC/CHLAMYDIA PROBE AMP (~~LOC~~) NOT AT ARMC
Chlamydia: NEGATIVE
NEISSERIA GONORRHEA: NEGATIVE

## 2017-01-30 LAB — DIFFERENTIAL
BASOS ABS: 0 10*3/uL (ref 0.0–0.1)
BASOS PCT: 0 %
Eosinophils Absolute: 0 10*3/uL (ref 0.0–0.7)
Eosinophils Relative: 0 %
LYMPHS PCT: 10 %
Lymphs Abs: 2.1 10*3/uL (ref 0.7–4.0)
MONO ABS: 1.4 10*3/uL — AB (ref 0.1–1.0)
Monocytes Relative: 7 %
NEUTROS ABS: 17.4 10*3/uL — AB (ref 1.7–7.7)
NEUTROS PCT: 83 %

## 2017-01-30 LAB — CBC
HCT: 32.3 % — ABNORMAL LOW (ref 36.0–46.0)
Hemoglobin: 11.2 g/dL — ABNORMAL LOW (ref 12.0–15.0)
MCH: 29.2 pg (ref 26.0–34.0)
MCHC: 34.7 g/dL (ref 30.0–36.0)
MCV: 84.1 fL (ref 78.0–100.0)
PLATELETS: 248 10*3/uL (ref 150–400)
RBC: 3.84 MIL/uL — AB (ref 3.87–5.11)
RDW: 13.1 % (ref 11.5–15.5)
WBC: 21.2 10*3/uL — ABNORMAL HIGH (ref 4.0–10.5)

## 2017-01-30 LAB — ALT: ALT: 17 U/L (ref 14–54)

## 2017-01-30 LAB — LIPASE, BLOOD: LIPASE: 17 U/L (ref 11–51)

## 2017-01-30 LAB — AST: AST: 22 U/L (ref 15–41)

## 2017-01-30 SURGERY — APPENDECTOMY
Anesthesia: Choice

## 2017-01-30 MED ORDER — SODIUM CHLORIDE 0.9 % IV SOLN
INTRAVENOUS | Status: DC
  Administered 2017-01-30 – 2017-01-31 (×2): via INTRAVENOUS

## 2017-01-30 MED ORDER — DEXAMETHASONE SODIUM PHOSPHATE 10 MG/ML IJ SOLN
INTRAMUSCULAR | Status: DC | PRN
  Administered 2017-01-30: 5 mg via INTRAVENOUS

## 2017-01-30 MED ORDER — PHENYLEPHRINE 40 MCG/ML (10ML) SYRINGE FOR IV PUSH (FOR BLOOD PRESSURE SUPPORT)
PREFILLED_SYRINGE | INTRAVENOUS | Status: AC
  Filled 2017-01-30: qty 10

## 2017-01-30 MED ORDER — LACTATED RINGERS IV SOLN
INTRAVENOUS | Status: DC | PRN
  Administered 2017-01-30: 16:00:00 via INTRAVENOUS

## 2017-01-30 MED ORDER — SODIUM CHLORIDE 0.9 % IR SOLN
Status: DC | PRN
  Administered 2017-01-30: 1000 mL

## 2017-01-30 MED ORDER — ONDANSETRON HCL 4 MG/2ML IJ SOLN
INTRAMUSCULAR | Status: AC
  Filled 2017-01-30: qty 2

## 2017-01-30 MED ORDER — HYDROMORPHONE HCL-NACL 0.5-0.9 MG/ML-% IV SOSY
PREFILLED_SYRINGE | INTRAVENOUS | Status: AC
  Administered 2017-01-30: 0.25 mg via INTRAVENOUS
  Filled 2017-01-30: qty 2

## 2017-01-30 MED ORDER — ACETAMINOPHEN 500 MG PO TABS
500.0000 mg | ORAL_TABLET | Freq: Once | ORAL | Status: AC
  Administered 2017-01-30: 500 mg via ORAL
  Filled 2017-01-30: qty 1

## 2017-01-30 MED ORDER — ONDANSETRON HCL 4 MG/2ML IJ SOLN
4.0000 mg | Freq: Four times a day (QID) | INTRAMUSCULAR | Status: DC | PRN
Start: 2017-01-30 — End: 2017-02-01

## 2017-01-30 MED ORDER — PHENYLEPHRINE HCL 10 MG/ML IJ SOLN
INTRAMUSCULAR | Status: DC | PRN
  Administered 2017-01-30: 40 ug via INTRAVENOUS

## 2017-01-30 MED ORDER — NIFEDIPINE 10 MG PO CAPS
10.0000 mg | ORAL_CAPSULE | ORAL | Status: DC | PRN
  Administered 2017-01-30: 10 mg via ORAL
  Filled 2017-01-30 (×3): qty 1

## 2017-01-30 MED ORDER — ONDANSETRON HCL 4 MG/2ML IJ SOLN
INTRAMUSCULAR | Status: DC | PRN
  Administered 2017-01-30: 4 mg via INTRAVENOUS

## 2017-01-30 MED ORDER — FENTANYL CITRATE (PF) 100 MCG/2ML IJ SOLN
INTRAMUSCULAR | Status: DC | PRN
  Administered 2017-01-30 (×3): 50 ug via INTRAVENOUS
  Administered 2017-01-30: 100 ug via INTRAVENOUS

## 2017-01-30 MED ORDER — PIPERACILLIN-TAZOBACTAM 3.375 G IVPB
3.3750 g | Freq: Three times a day (TID) | INTRAVENOUS | Status: DC
  Administered 2017-01-30 – 2017-02-01 (×5): 3.375 g via INTRAVENOUS
  Filled 2017-01-30 (×6): qty 50

## 2017-01-30 MED ORDER — ROCURONIUM BROMIDE 50 MG/5ML IV SOSY
PREFILLED_SYRINGE | INTRAVENOUS | Status: AC
  Filled 2017-01-30: qty 5

## 2017-01-30 MED ORDER — SUCCINYLCHOLINE CHLORIDE 200 MG/10ML IV SOSY
PREFILLED_SYRINGE | INTRAVENOUS | Status: AC
  Filled 2017-01-30: qty 10

## 2017-01-30 MED ORDER — IOPAMIDOL (ISOVUE-300) INJECTION 61%
30.0000 mL | Freq: Once | INTRAVENOUS | Status: AC | PRN
Start: 2017-01-30 — End: 2017-01-30
  Administered 2017-01-30: 30 mL via ORAL

## 2017-01-30 MED ORDER — PROMETHAZINE HCL 25 MG/ML IJ SOLN
25.0000 mg | Freq: Once | INTRAMUSCULAR | Status: AC
  Administered 2017-01-30: 25 mg via INTRAVENOUS
  Filled 2017-01-30: qty 1

## 2017-01-30 MED ORDER — LIDOCAINE 2% (20 MG/ML) 5 ML SYRINGE
INTRAMUSCULAR | Status: AC
  Filled 2017-01-30: qty 5

## 2017-01-30 MED ORDER — SUGAMMADEX SODIUM 200 MG/2ML IV SOLN
INTRAVENOUS | Status: AC
  Filled 2017-01-30: qty 2

## 2017-01-30 MED ORDER — ROCURONIUM BROMIDE 100 MG/10ML IV SOLN
INTRAVENOUS | Status: DC | PRN
  Administered 2017-01-30: 30 mg via INTRAVENOUS

## 2017-01-30 MED ORDER — ONDANSETRON 4 MG PO TBDP
4.0000 mg | ORAL_TABLET | Freq: Four times a day (QID) | ORAL | Status: DC | PRN
  Filled 2017-01-30: qty 1

## 2017-01-30 MED ORDER — IOPAMIDOL (ISOVUE-300) INJECTION 61%
100.0000 mL | Freq: Once | INTRAVENOUS | Status: AC | PRN
  Administered 2017-01-30: 100 mL via INTRAVENOUS

## 2017-01-30 MED ORDER — PIPERACILLIN-TAZOBACTAM 3.375 G IVPB
INTRAVENOUS | Status: AC
  Filled 2017-01-30: qty 50

## 2017-01-30 MED ORDER — PIPERACILLIN-TAZOBACTAM 3.375 G IVPB: 3.3750 g | INTRAVENOUS | Status: DC

## 2017-01-30 MED ORDER — ONDANSETRON HCL 4 MG/2ML IJ SOLN: 4.0000 mg | Freq: Once | INTRAMUSCULAR | Status: DC | PRN

## 2017-01-30 MED ORDER — LIDOCAINE HCL (CARDIAC) 20 MG/ML IV SOLN
INTRAVENOUS | Status: DC | PRN
  Administered 2017-01-30: 50 mg via INTRAVENOUS

## 2017-01-30 MED ORDER — SUGAMMADEX SODIUM 200 MG/2ML IV SOLN
INTRAVENOUS | Status: DC | PRN
  Administered 2017-01-30: 200 mg via INTRAVENOUS

## 2017-01-30 MED ORDER — NIFEDIPINE ER OSMOTIC RELEASE 30 MG PO TB24
30.0000 mg | ORAL_TABLET | Freq: Two times a day (BID) | ORAL | Status: DC
  Administered 2017-01-30 – 2017-02-01 (×4): 30 mg via ORAL
  Filled 2017-01-30 (×4): qty 1

## 2017-01-30 MED ORDER — DIPHENHYDRAMINE HCL 50 MG/ML IJ SOLN: 25.0000 mg | Freq: Four times a day (QID) | INTRAMUSCULAR | Status: DC | PRN

## 2017-01-30 MED ORDER — FENTANYL CITRATE (PF) 250 MCG/5ML IJ SOLN
INTRAMUSCULAR | Status: AC
  Filled 2017-01-30: qty 5

## 2017-01-30 MED ORDER — LACTATED RINGERS IV SOLN
INTRAVENOUS | Status: DC
  Administered 2017-01-30 (×2): via INTRAVENOUS

## 2017-01-30 MED ORDER — LACTATED RINGERS IV BOLUS (SEPSIS)
1000.0000 mL | Freq: Once | INTRAVENOUS | Status: AC
  Administered 2017-01-30: 1000 mL via INTRAVENOUS

## 2017-01-30 MED ORDER — HYDROMORPHONE HCL 1 MG/ML IJ SOLN
0.5000 mg | INTRAMUSCULAR | Status: DC | PRN
  Administered 2017-01-30: 1 mg via INTRAVENOUS
  Administered 2017-01-30 – 2017-01-31 (×2): 0.5 mg via INTRAVENOUS
  Administered 2017-01-31: 1 mg via INTRAVENOUS
  Administered 2017-02-01: 0.5 mg via INTRAVENOUS
  Filled 2017-01-30 (×5): qty 1

## 2017-01-30 MED ORDER — ALBUTEROL SULFATE (2.5 MG/3ML) 0.083% IN NEBU: 3.0000 mL | INHALATION_SOLUTION | Freq: Four times a day (QID) | RESPIRATORY_TRACT | Status: DC | PRN

## 2017-01-30 MED ORDER — PROPOFOL 10 MG/ML IV BOLUS
INTRAVENOUS | Status: AC
  Filled 2017-01-30: qty 20

## 2017-01-30 MED ORDER — DIPHENHYDRAMINE HCL 25 MG PO CAPS: 25.0000 mg | ORAL_CAPSULE | Freq: Four times a day (QID) | ORAL | Status: DC | PRN

## 2017-01-30 MED ORDER — HYDROMORPHONE HCL 2 MG/ML IJ SOLN
2.0000 mg | INTRAMUSCULAR | Status: DC | PRN
  Administered 2017-01-30 (×2): 2 mg via INTRAVENOUS
  Filled 2017-01-30 (×2): qty 1

## 2017-01-30 MED ORDER — SUCCINYLCHOLINE CHLORIDE 20 MG/ML IJ SOLN
INTRAMUSCULAR | Status: DC | PRN
  Administered 2017-01-30: 120 mg via INTRAVENOUS

## 2017-01-30 MED ORDER — ACETAMINOPHEN 500 MG PO TABS
1000.0000 mg | ORAL_TABLET | Freq: Four times a day (QID) | ORAL | Status: DC | PRN
  Administered 2017-01-31: 1000 mg via ORAL
  Filled 2017-01-30 (×2): qty 2

## 2017-01-30 MED ORDER — BETAMETHASONE SOD PHOS & ACET 6 (3-3) MG/ML IJ SUSP
12.0000 mg | INTRAMUSCULAR | Status: AC
  Administered 2017-01-30 – 2017-01-31 (×2): 12 mg via INTRAMUSCULAR
  Filled 2017-01-30 (×3): qty 2

## 2017-01-30 MED ORDER — BETAMETHASONE SOD PHOS & ACET 6 (3-3) MG/ML IJ SUSP
12.0000 mg | Freq: Once | INTRAMUSCULAR | Status: DC
  Filled 2017-01-30: qty 2

## 2017-01-30 MED ORDER — HYDROMORPHONE HCL-NACL 0.5-0.9 MG/ML-% IV SOSY
0.2500 mg | PREFILLED_SYRINGE | INTRAVENOUS | Status: DC | PRN
  Administered 2017-01-30 (×2): 0.25 mg via INTRAVENOUS

## 2017-01-30 MED ORDER — PROPOFOL 10 MG/ML IV BOLUS
INTRAVENOUS | Status: DC | PRN
  Administered 2017-01-30: 140 mg via INTRAVENOUS

## 2017-01-30 MED ORDER — LACTATED RINGERS IV SOLN
INTRAVENOUS | Status: DC
  Administered 2017-01-30: 08:00:00 via INTRAVENOUS

## 2017-01-30 MED ORDER — PRENATAL MULTIVITAMIN CH
1.0000 | ORAL_TABLET | Freq: Every day | ORAL | Status: DC
  Administered 2017-01-31: 1 via ORAL
  Filled 2017-01-30: qty 1

## 2017-01-30 MED ORDER — PIPERACILLIN-TAZOBACTAM 3.375 G IVPB 30 MIN
3.3750 g | Freq: Once | INTRAVENOUS | Status: AC
  Administered 2017-01-30: 3.375 g via INTRAVENOUS

## 2017-01-30 MED ORDER — DEXAMETHASONE SODIUM PHOSPHATE 10 MG/ML IJ SOLN
INTRAMUSCULAR | Status: AC
  Filled 2017-01-30: qty 1

## 2017-01-30 SURGICAL SUPPLY — 30 items
CHLORAPREP W/TINT 26ML (MISCELLANEOUS) ×3 IMPLANT
COVER SURGICAL LIGHT HANDLE (MISCELLANEOUS) ×3 IMPLANT
DRAPE LAPAROTOMY TRNSV 102X78 (DRAPE) ×3 IMPLANT
ELECT REM PT RETURN 15FT ADLT (MISCELLANEOUS) ×3 IMPLANT
EVACUATOR DRAINAGE 10X20 100CC (DRAIN) IMPLANT
EVACUATOR SILICONE 100CC (DRAIN)
GAUZE SPONGE 4X4 12PLY STRL (GAUZE/BANDAGES/DRESSINGS) ×3 IMPLANT
GLOVE BIOGEL PI IND STRL 7.0 (GLOVE) ×1 IMPLANT
GLOVE BIOGEL PI INDICATOR 7.0 (GLOVE) ×2
GLOVE SURG ORTHO 8.0 STRL STRW (GLOVE) ×3 IMPLANT
GOWN STRL REUS W/TWL LRG LVL3 (GOWN DISPOSABLE) ×3 IMPLANT
GOWN STRL REUS W/TWL XL LVL3 (GOWN DISPOSABLE) ×6 IMPLANT
HANDLE SUCTION POOLE (INSTRUMENTS) ×1 IMPLANT
KIT BASIN OR (CUSTOM PROCEDURE TRAY) ×3 IMPLANT
MARKER SKIN DUAL TIP RULER LAB (MISCELLANEOUS) IMPLANT
NS IRRIG 1000ML POUR BTL (IV SOLUTION) ×3 IMPLANT
PACK GENERAL/GYN (CUSTOM PROCEDURE TRAY) ×3 IMPLANT
RELOAD STAPLER LINEAR PROX 30 (STAPLE) ×1 IMPLANT
SHEARS FOC LG CVD HARMONIC 17C (MISCELLANEOUS) ×3 IMPLANT
SPONGE LAP 4X18 X RAY DECT (DISPOSABLE) IMPLANT
STAPLER RELOAD LINEAR PROX 30 (STAPLE) ×3
STAPLER VISISTAT 35W (STAPLE) ×3 IMPLANT
SUCTION POOLE HANDLE (INSTRUMENTS) ×3
SUT VICRYL 0 27 CT2 27 ABS (SUTURE) ×6 IMPLANT
TOWEL OR 17X26 10 PK STRL BLUE (TOWEL DISPOSABLE) ×3 IMPLANT
TOWEL OR NON WOVEN STRL DISP B (DISPOSABLE) ×3 IMPLANT
TRAY FOLEY W/METER SILVER 14FR (SET/KITS/TRAYS/PACK) IMPLANT
TRAY FOLEY W/METER SILVER 16FR (SET/KITS/TRAYS/PACK) IMPLANT
WATER STERILE IRR 1500ML POUR (IV SOLUTION) ×3 IMPLANT
YANKAUER SUCT BULB TIP 10FT TU (MISCELLANEOUS) ×3 IMPLANT

## 2017-01-30 NOTE — ED Notes (Signed)
OB rapid response at bedside, will follow pt to OR

## 2017-01-30 NOTE — ED Notes (Signed)
Bed: WA09 Expected date:  Expected time:  Means of arrival:  Comments: Transfer from [facility]

## 2017-01-30 NOTE — ED Provider Notes (Signed)
26 year old female presents via [facility] from the [facility] for acute appendicitis.  Patient originally febrile with CT evidence of acute appendicitis.  Case was discussed with general surgery who recommended transfer to [facility] for surgery.  Patient resting comfortably in exam bed in no acute distress, complaining of right lower quadrant abdominal pain.  General surgery will be notified patient has arrived.  Vitals:   01/30/17 1330 01/30/17 1433  BP: (!) 110/52 (!) 105/54  Pulse: (!) 127 (!) 127  Resp: (!) 24 20  Temp: 98.4 F (36.9 C)   SpO2: 94% 99%     Eyvonne Mechanic, PA-C 01/30/17 1531    Loren Racer, MD 01/30/17 1610

## 2017-01-30 NOTE — Progress Notes (Signed)
   01/30/17 0714  Vital Signs  BP (!) 98/51  BP Location Left Arm  Patient Position (if appropriate) Semi-fowlers  BP Method Automatic  Pulse Rate (!) 114  Pulse Rate Source Dinamap  Resp (!) 36  Temp 99.4 F (37.4 C)  Temp Source Oral  Oxygen Therapy  SpO2 98 %  O2 Device Room [id number]

## 2017-01-30 NOTE — Progress Notes (Signed)
Phoned Judeth Horn, NP with pt's current VS and asked that she come reevaluate patient.  She said she was on her way.

## 2017-01-30 NOTE — Progress Notes (Signed)
Pt having mild contractions , considered to be expected after surgery, will initiate tocolysis with Procarda XL 30 bid

## 2017-01-30 NOTE — H&P (Signed)
[name] is an 26 y.o. female.   Chief Complaint:  Preterm contractions, nausea, vomiting and constipation Reason for consult:  Appendicitis in women [redacted] weeks gestation HPI: Pt is [redacted] weeks gestation and was seen yesterday for what was thought to be preterm contractions. She says the pain started 24 hour before she came to the ED yesterday.  She describes the pain as burning.   She was sent home and pain returned along with the nausea and vomiting.  She was so miserable she could not sleep and nothing made her comfortable.  She  returns with ongoing pain,  nausea and vomiting.  She has some low grade fever, 10/10 pain in the RLQ.    Work up in [employer] shows a low grade fever in the ED at 100.2.  She is tachycardic.CMP is OK, WBC is 21.2, H/H is 11/32, platelets are normal at 248K.  CT scan is consistent with acute appendicitis. DR Harlow Asa  discussed with Dr. Harolyn Rutherford.  It has been opted to transfer her to [facility] for surgery.    Past Medical History:  Diagnosis Date  . Asthma   . Gall stones   . Seasonal allergies     Past Surgical History:  Procedure Laterality Date  . CESAREAN SECTION     C/S x 1  . CHOLECYSTECTOMY      Family History  Problem Relation Age of Onset  . Diabetes Maternal Grandfather    Social History:  reports that she has never smoked. She has never used smokeless tobacco. She reports that she does not drink alcohol or use drugs. Married 3 children at home age 17, 39, 4  ETOH:  None DRUGS:  None Tobacco:  None   Allergies:  Allergies  Allergen Reactions  . Avocado Anaphylaxis  . Norco [Hydrocodone-Acetaminophen] Nausea Only    dizziness  . Strawberry Extract Hives, Shortness Of Breath and Swelling  . Fentanyl-Bupivacaine-Nacl Rash    Pt states epideral caused rash all over body    Medications Prior to Admission  Medication Sig Dispense Refill  . acetaminophen (TYLENOL) 500 MG tablet Take 1,000 mg by mouth every 6 (six) hours as needed for  mild pain, moderate pain or headache.    . metoCLOPramide (REGLAN) 10 MG tablet Take 1 tablet (10 mg total) by mouth 3 (three) times daily before meals. 90 tablet 1  . metroNIDAZOLE (METROGEL VAGINAL) 0.75 % vaginal gel Place 1 Applicatorful vaginally at bedtime. 50 g 0  . Prenatal Vit-Fe Fumarate-FA (PRENATAL MULTIVITAMIN) TABS tablet Take 1 tablet by mouth daily at 12 noon.    [name] albuterol (PROVENTIL HFA;VENTOLIN HFA) 108 (90 BASE) MCG/ACT inhaler Inhale 2 puffs into the lungs every 6 (six) hours as needed for wheezing or shortness of breath.    . promethazine (PHENERGAN) 25 MG tablet Take 1 tablet (25 mg total) by mouth every 6 (six) hours as needed for nausea or vomiting. (Patient not taking: Reported on 01/29/2017) 30 tablet 2    Results for orders placed or performed during the hospital encounter of 01/30/17 (from the past 48 hour(s))  Urinalysis, Routine w reflex microscopic     Status: Abnormal   Collection Time: 01/30/17  3:23 AM  Result Value Ref Range   Color, Urine YELLOW YELLOW   APPearance CLEAR CLEAR   Specific Gravity, Urine 1.017 1.005 - 1.030   pH 6.0 5.0 - 8.0   Glucose, UA NEGATIVE NEGATIVE mg/dL   Hgb urine dipstick NEGATIVE NEGATIVE   Bilirubin Urine NEGATIVE NEGATIVE  Ketones, ur 80 (A) NEGATIVE mg/dL   Protein, ur NEGATIVE NEGATIVE mg/dL   Nitrite NEGATIVE NEGATIVE   Leukocytes, UA NEGATIVE NEGATIVE  CBC     Status: Abnormal   Collection Time: 01/30/17  4:07 AM  Result Value Ref Range   WBC 21.2 (H) 4.0 - 10.5 K/uL   RBC 3.84 (L) 3.87 - 5.11 MIL/uL   Hemoglobin 11.2 (L) 12.0 - 15.0 g/dL   HCT 32.3 (L) 36.0 - 46.0 %   MCV 84.1 78.0 - 100.0 fL   MCH 29.2 26.0 - 34.0 pg   MCHC 34.7 30.0 - 36.0 g/dL   RDW 13.1 11.5 - 15.5 %   Platelets 248 150 - 400 K/uL  Basic metabolic panel     Status: Abnormal   Collection Time: 01/30/17  4:07 AM  Result Value Ref Range   Sodium 134 (L) 135 - 145 mmol/L   Potassium 3.5 3.5 - 5.1 mmol/L   Chloride 106 101 - 111 mmol/L    CO2 17 (L) 22 - 32 mmol/L   Glucose, Bld 93 65 - 99 mg/dL   BUN 5 (L) 6 - 20 mg/dL   Creatinine, Ser 0.34 (L) 0.44 - 1.00 mg/dL   Calcium 8.6 (L) 8.9 - 10.3 mg/dL   GFR calc non Af Amer >60 >60 mL/min   GFR calc Af Amer >60 >60 mL/min    Comment: (NOTE) The eGFR has been calculated using the CKD EPI equation. This calculation has not been validated in all clinical situations. eGFR's persistently <60 mL/min signify possible Chronic Kidney Disease.    Anion gap 11 5 - 15  Differential     Status: Abnormal   Collection Time: 01/30/17  4:07 AM  Result Value Ref Range   Neutrophils Relative % 83 %   Neutro Abs 17.4 (H) 1.7 - 7.7 K/uL   Lymphocytes Relative 10 %   Lymphs Abs 2.1 0.7 - 4.0 K/uL   Monocytes Relative 7 %   Monocytes Absolute 1.4 (H) 0.1 - 1.0 K/uL   Eosinophils Relative 0 %   Eosinophils Absolute 0.0 0.0 - 0.7 K/uL   Basophils Relative 0 %   Basophils Absolute 0.0 0.0 - 0.1 K/uL  AST     Status: None   Collection Time: 01/30/17  4:07 AM  Result Value Ref Range   AST 22 15 - 41 U/L  ALT     Status: None   Collection Time: 01/30/17  4:07 AM  Result Value Ref Range   ALT 17 14 - 54 U/L  Lipase, blood     Status: None   Collection Time: 01/30/17  4:07 AM  Result Value Ref Range   Lipase 17 11 - 51 U/L   [place] Mfm Ob Limited  Result Date: 01/30/2017 ----------------------------------------------------------------------  OBSTETRICS REPORT                      (Signed Final 01/30/2017 10:23 am) ---------------------------------------------------------------------- Patient Info  ID #:       [id number]                          D.O.B.:  [birth year] (26 yrs)  Name:       [name]                         Visit Date: 01/30/2017 08:46 am              [place] ----------------------------------------------------------------------  Performed By  Performed By:     Jeanene Erb BS,      Ref. Address:     [facility]                                                             [place]                                                             [place], [place]  Attending:        Seward Meth MD         Location:         [facility]  Referred By:      [facility] for                    [place] ---------------------------------------------------------------------- Orders   #  Description                                 Code   1  [place] MFM OB LIMITED                           09735.32  ----------------------------------------------------------------------   #  Ordered By               Order #        Accession #    Episode #   1  Jorje Guild            [id number]      [id number]     [id number]  ----------------------------------------------------------------------  Indications   [redacted] weeks gestation of pregnancy                Z3A.33   History of cesarean delivery, currently        O34.219   pregnant   Obesity complicating pregnancy, second         O99.212   trimester(Pre-preg BMI = 36.2)   Abdominal pain in pregnancy (RLQ pain)         O99.89  ---------------------------------------------------------------------- OB History  Blood Type:            Height:  5'2"   Weight (lb):  192       BMI:  35.11  Gravidity:    4         Term:   3  Living:       3 ---------------------------------------------------------------------- Fetal Evaluation  Num Of Fetuses:     1  Fetal Heart         155  Rate(bpm):  Cardiac Activity:   Observed  Presentation:       Cephalic  Placenta:           Anterior, above cervical os  P. Cord Insertion:  Previously Visualized  Amniotic Fluid  AFI FV:      Subjectively within normal limits  AFI Sum(cm)     %Tile       Largest Pocket(cm)  17.56           64          5.67  RUQ(cm)       RLQ(cm)        LUQ(cm)        LLQ(cm)  5.67          4.67          3.27           3.95 ---------------------------------------------------------------------- Gestational Age  LMP:           33w 3d        Date:  06/10/16                 EDD:   03/17/17  Best:          33w 3d     Det. By:  LMP  (06/10/16)          EDD:   03/17/17 ---------------------------------------------------------------------- Cervix Uterus Adnexa  Cervix  Not visualized (advanced GA >29wks)  Left Ovary  Size(cm)       3.2  x   1.4    x  2.2       Vol(ml): 5.2  Within normal limits.  Right Ovary  Size(cm)       2.6  x   1.4    x  1.4       Vol(ml): 2.7  Within normal limits.  Adnexa:       No adnexal mass visualized.  Comment:      Dilated vessels in right adnexa have                appearance of  pelvic vascular congestion. ---------------------------------------------------------------------- Impression  Singleton intrauterine pregnancy at 33 weeks 3 days  gestation with fetal cardiac activity  Cephalic presentation  Anterior placenta without evidence of previa  AFI > 17 cm  No sonographic evidence of intrauterine bleed ---------------------------------------------------------------------- Recommendations  Follow-up ultrasounds as clinically indicated. ----------------------------------------------------------------------                   Seward Meth, MD Electronically  Signed Final Report   01/30/2017 10:23 am ----------------------------------------------------------------------   Review of Systems  Constitutional: Positive for fever. Negative for chills, diaphoresis, malaise/fatigue and weight loss.  HENT: Negative.   Eyes: Negative.   Respiratory: Negative.   Cardiovascular: Negative.   Gastrointestinal: Positive for abdominal pain, constipation, nausea and vomiting. Negative for blood in stool, diarrhea, heartburn and melena.  Genitourinary: Negative.   Musculoskeletal: Negative.   Skin: Negative.   Neurological: Negative.  Negative for  weakness.  Endo/Heme/Allergies: Negative.   Psychiatric/Behavioral: Negative.     Blood pressure (!) 100/47, pulse (!) 114, temperature 98.4 F (36.9 C), temperature source Oral, resp. rate (!) 28, height 5' 2"  (1.575 m), weight 88.5 kg (195 lb), last menstrual period 06/10/2016, SpO2 95 %, unknown if currently breastfeeding. Physical Exam  Constitutional: She is oriented to person, place, and time. She appears well-developed and well-nourished. No distress.  [redacted] weeks gestation  HENT:  Head: Normocephalic and atraumatic.  Mouth/Throat: No oropharyngeal exudate.  Eyes: Conjunctivae are normal. Right eye exhibits no discharge. Left eye exhibits no discharge. No scleral icterus.  Pupils are equal  Neck: Normal range of motion. Neck supple. No JVD present. No tracheal deviation present. No thyromegaly present.  Cardiovascular: Normal rate, regular rhythm, normal heart sounds and intact distal pulses.   No murmur heard. Respiratory: Effort normal and breath sounds normal. No respiratory distress. She has no wheezes. She has no rales. She exhibits no tenderness.  GI: She exhibits distension. She exhibits no mass. There is tenderness (Most tender RLQ and right mid lateral abdomen.  No BS). There is no rebound and no guarding.  Musculoskeletal: She exhibits no edema or tenderness.  Lymphadenopathy:    She has no cervical adenopathy.  Neurological: She is alert and oriented to person, place, and time. No cranial nerve deficit.  Skin: Skin is warm and dry. No rash noted. She is not diaphoretic. No erythema. No pallor.  Psychiatric: She has a normal mood and affect. Her behavior is normal. Judgment and thought content normal.     Assessment/Plan Acute appendicitis [redacted] weeks gestation she has 3 children at home.   S/p lap cholecystectomy/c-sections  Plan:  She has been seen and CT evaluated by Dr Harlow Asa.  He plans surgery with open appendectomy ASAP.  OB rapid response has been called and ask to  see/attend to her.  I ask for stepdown bed for close fetal monitoring.  If there are any issues with her after surgery we can also follow her at [facility] if that is necessary.      Elliott Quade, PA-C 01/30/2017, 12:56 PM

## 2017-01-30 NOTE — Anesthesia Procedure Notes (Signed)
Procedure Name: Intubation Date/Time: 01/30/2017 4:03 PM Performed by: Thornell Mule Pre-anesthesia Checklist: Patient identified, Emergency Drugs available, Suction available and Patient being monitored Patient Re-evaluated:Patient Re-evaluated prior to induction Oxygen Delivery Method: Circle system utilized Preoxygenation: Pre-oxygenation with 100% oxygen Induction Type: IV induction, Rapid sequence and Cricoid Pressure applied Ventilation: Mask ventilation without difficulty Laryngoscope Size: Miller and 3 Grade View: Grade I Tube type: Oral Number of attempts: 1 Airway Equipment and Method: Stylet and Oral airway Placement Confirmation: ETT inserted through vocal cords under direct vision,  positive ETCO2 and breath sounds checked- equal and bilateral Secured at: 20 cm Tube secured with: Tape Dental Injury: Teeth and Oropharynx as per pre-operative assessment

## 2017-01-30 NOTE — MAU Provider Note (Signed)
History     CSN: [id number]  Arrival date and time: 01/30/17 4098  First Provider Initiated Contact with Patient 01/30/17 0401      Chief Complaint  Patient presents with  . Nausea  . Emesis  . Abdominal Pain   HPI [name] is a 26 y.o. G4P3003 at [redacted]w[redacted]d who presents with nausea/vomiting & contractions. Was seen in MAU with same complaint. Had negative FFN & cervix was closed. States contractions have worsened throughout the evening. Rates contractions 10/10. Has reportedly vomited 7 times in the last 24 hours & has not taken antiemetic at home. States she has felt hot at home but did not check temperature. Took tylenol at 10 pm without relief. Denies sore throat, cough, body aches, sick contacts, vaginal discharge, dysuria, vaginal bleeding, or diarrhea. Last BM was yesterday.     OB History    Gravida Para Term Preterm AB Living   0   3   SAB TAB Ectopic Multiple Live Births           3      Past Medical History:  Diagnosis Date  . Asthma   . Gall stones   . Seasonal allergies     Past Surgical History:  Procedure Laterality Date  . CESAREAN SECTION     C/S x 1  . CHOLECYSTECTOMY      Family History  Problem Relation Age of Onset  . Diabetes Maternal Grandfather     Social History  Substance Use Topics  . Smoking status: Never Smoker  . Smokeless tobacco: Never Used  . Alcohol use No    Allergies:  Allergies  Allergen Reactions  . Avocado Anaphylaxis  . Norco [Hydrocodone-Acetaminophen] Nausea Only    dizziness  . Strawberry Extract Hives, Shortness Of Breath and Swelling  . Fentanyl-Bupivacaine-Nacl Rash    Pt states epideral caused rash all over body    Prescriptions Prior to Admission  Medication Sig Dispense Refill Last Dose  . acetaminophen (TYLENOL) 500 MG tablet Take 1,000 mg by mouth every 6 (six) hours as needed for mild pain, moderate pain or headache.   01/29/2017 at Unknown time  . metoCLOPramide (REGLAN) 10 MG tablet  Take 1 tablet (10 mg total) by mouth 3 (three) times daily before meals. 90 tablet 1 Past Week at Unknown time  . metroNIDAZOLE (METROGEL VAGINAL) 0.75 % vaginal gel Place 1 Applicatorful vaginally at bedtime. 50 g 0 01/29/2017 at Unknown time  . Prenatal Vit-Fe Fumarate-FA (PRENATAL MULTIVITAMIN) TABS tablet Take 1 tablet by mouth daily at 12 noon.   01/29/2017 at Unknown time  . albuterol (PROVENTIL HFA;VENTOLIN HFA) 108 (90 BASE) MCG/ACT inhaler Inhale 2 puffs into the lungs every 6 (six) hours as needed for wheezing or shortness of breath.   Unknown at Unknown time  . promethazine (PHENERGAN) 25 MG tablet Take 1 tablet (25 mg total) by mouth every 6 (six) hours as needed for nausea or vomiting. (Patient not taking: Reported on 01/29/2017) 30 tablet 2 Not Taking at Unknown time    Review of Systems  Constitutional: Negative.   HENT: Negative for ear pain and sore throat.   Respiratory: Negative for cough and shortness of breath.   Gastrointestinal: Positive for abdominal pain, nausea and vomiting. Negative for constipation and diarrhea.  Genitourinary: Negative for dysuria, vaginal bleeding and vaginal discharge.  Neurological: Positive for headaches.   Physical Exam   Blood pressure (!) 110/52, pulse (!) 127, temperature 98.4 F (36.9 C), temperature source  Oral, resp. rate (!) 24, height  (1.575 m), weight 195 lb (88.5 kg), last menstrual period 06/10/2016, SpO2 94 %, unknown if currently breastfeeding.  Patient Vitals for the past 24 hrs:  BP Temp Temp src Pulse Resp SpO2 Height Weight  01/30/17 1330 (!) 110/52 98.4 F (36.9 C) Oral (!) 127 (!) 24 94 % - -  01/30/17 1300 (!) 104/54 - - (!) 125 - 95 % - -  01/30/17 1245 (!) 100/47 98.4 F (36.9 C) Oral (!) 114 (!) 28 95 % - -  01/30/17 1100 (!) 102/49 - - (!) 115 - 97 % - -  01/30/17 1030 (!) 96/52 - - (!) 125 (!) 30 97 % - -  01/30/17 1000 (!) 101/49 - - (!) 134 - 97 % - -  01/30/17 0945 (!) 105/52 - - (!) 117 - 96 % - -   01/30/17 0930 115/60 - - (!) 116 - 99 % - -  01/30/17 0911 (!) 93/45 98.5 F (36.9 C) Oral (!) 130 (!) 26 98 % - -  01/30/17 0800 (!) 99/50 99.2 F (37.3 C) Oral (!) 119 - 98 % - -  01/30/17 0740 (!) 104/53 - - (!) 118 (!) 32 97 % - -  01/30/17 0714 (!) 98/51 99.4 F (37.4 C) Oral (!) 114 (!) 36 98 % - -  01/30/17 0608 (!) 98/52 100.2 F (37.9 C) Oral (!) 132 - - - -  01/30/17 0528 (!) 103/49 99.4 F (37.4 C) Oral (!) 111 - - - -  01/30/17 0500 (!) 109/55 - - (!) 102 16 - - -  01/30/17 0445 - 99.3 F (37.4 C) Oral - - - - -  01/30/17 0443 - - - (!) 108 - - - -  01/30/17 0430 - - - (!) 108 - - - -  01/30/17 0404 - 99.1 F (37.3 C) Oral - - - - -  01/30/17 0337 (!) 105/59 98.6 F (37 C) Oral (!) 123 18 98 % - -  01/30/17 0329 - - - - - -  (1.575 m) 195 lb (88.5 kg)     Physical Exam  Nursing note and vitals reviewed. Constitutional: She is oriented to person, place, and time. She appears well-developed and well-nourished. No distress.  HENT:  Head: Normocephalic and atraumatic.  Eyes: Conjunctivae are normal. Right eye exhibits no discharge. Left eye exhibits no discharge. No scleral icterus.  Neck: Normal range of motion.  Cardiovascular: Normal rate, regular rhythm and normal heart sounds.   No murmur heard. Respiratory: Effort normal and breath sounds normal. No respiratory distress. She has no wheezes.  GI: Soft. There is no tenderness. There is no rebound and no guarding.  Neurological: She is alert and oriented to person, place, and time.  Skin: Skin is warm and dry. She is not diaphoretic.  Psychiatric: She has a normal mood and affect. Her behavior is normal. Judgment and thought content normal.   Dilation: Closed Effacement (%): Thick Cervical Position: Posterior Exam by:: Judeth Horn NP  Fetal Tracing:  Baseline: 160 Variability: moderate Accelerations: 15x15 Decelerations: none  Toco: Q3-5 mins MAU Course  Procedures Results for orders placed  or performed during the hospital encounter of 01/30/17 (from the past 24 hour(s))  Urinalysis, Routine w reflex microscopic     Status: Abnormal   Collection Time: 01/30/17  3:23 AM  Result Value Ref Range   Color, Urine YELLOW YELLOW   APPearance CLEAR CLEAR   Specific Gravity,  Urine 1.017 1.005 - 1.030   pH 6.0 5.0 - 8.0   Glucose, UA NEGATIVE NEGATIVE mg/dL   Hgb urine dipstick NEGATIVE NEGATIVE   Bilirubin Urine NEGATIVE NEGATIVE   Ketones, ur 80 (A) NEGATIVE mg/dL   Protein, ur NEGATIVE NEGATIVE mg/dL   Nitrite NEGATIVE NEGATIVE   Leukocytes, UA NEGATIVE NEGATIVE  CBC     Status: Abnormal   Collection Time: 01/30/17  4:07 AM  Result Value Ref Range   WBC 21.2 (H) 4.0 - 10.5 K/uL   RBC 3.84 (L) 3.87 - 5.11 MIL/uL   Hemoglobin 11.2 (L) 12.0 - 15.0 g/dL   HCT 16.1 (L) 09.6 - 04.5 %   MCV 84.1 78.0 - 100.0 fL   MCH 29.2 26.0 - 34.0 pg   MCHC 34.7 30.0 - 36.0 g/dL   RDW 40.9 81.1 - 91.4 %   Platelets 248 150 - 400 K/uL  Basic metabolic panel     Status: Abnormal   Collection Time: 01/30/17  4:07 AM  Result Value Ref Range   Sodium 134 (L) 135 - 145 mmol/L   Potassium 3.5 3.5 - 5.1 mmol/L   Chloride 106 101 - 111 mmol/L   CO2 17 (L) 22 - 32 mmol/L   Glucose, Bld 93 65 - 99 mg/dL   BUN 5 (L) 6 - 20 mg/dL   Creatinine, Ser 7.82 (L) 0.44 - 1.00 mg/dL   Calcium 8.6 (L) 8.9 - 10.3 mg/dL   GFR calc non Af Amer >60 >60 mL/min   GFR calc Af Amer >60 >60 mL/min   Anion gap 11 5 - 15  Differential     Status: Abnormal   Collection Time: 01/30/17  4:07 AM  Result Value Ref Range   Neutrophils Relative % 83 %   Neutro Abs 17.4 (H) 1.7 - 7.7 K/uL   Lymphocytes Relative 10 %   Lymphs Abs 2.1 0.7 - 4.0 K/uL   Monocytes Relative 7 %   Monocytes Absolute 1.4 (H) 0.1 - 1.0 K/uL   Eosinophils Relative 0 %   Eosinophils Absolute 0.0 0.0 - 0.7 K/uL   Basophils Relative 0 %   Basophils Absolute 0.0 0.0 - 0.1 K/uL  AST     Status: None   Collection Time: 01/30/17  4:07 AM  Result  Value Ref Range   AST 22 15 - 41 U/L  ALT     Status: None   Collection Time: 01/30/17  4:07 AM  Result Value Ref Range   ALT 17 14 - 54 U/L  Lipase, blood     Status: None   Collection Time: 01/30/17  4:07 AM  Result Value Ref Range   Lipase 17 11 - 51 U/L    MDM Reactive NST, borderline tachycardic Ctx every 3-5 minutes. Cervix remains closed. Temp 99.3. IV fluid bolus ordered & phenergan 25 mg. CBC & CMP.  C/w Dr. Vergie Living. Reviewed symptoms, exam, VS, & CBC. After treatment of symptoms & AST/ALT wnl, can discharge home with phenergan & procardia. Symptoms & leukocytosis likely d/t viral gastroenteritis.  Patient/fetus remain tachycardic & pt with low grade fever despite tylenol. Patient reports worsening abdominal pain. Not observed vomiting while in MAU. RN contacted me regarding tachypnea. I reexamined patient; no longer tachypneic but reports worsening abdominal pain & states feels like she has fever. Patient with new onset RLQ tenderness; no rebound. Reports feeling "full" & increased pain in RLQ regardless of contractions. Patient continues to contract but withholding procardia d/t low BP.  Discussed  updated assessment of patient with Dr. Vergie Living. Will make NPO & order imaging; ultrasound to start with.   Care turned over to [facility] Donette Larry, [place] 01/30/2017 2:08 PM   0911: Consult with Dr. Debroah Loop, OB US nml, plan for CT to r/o appendicitis.  1300: Dr. Macon Large in consult with general surgery. Maintain NPO status. Awaiting coordination of care.  1408: VSS. Pain well controlled, pt comfortable. FHT reassuring. Transfer via [facility] in stable condition  Assessment and Plan  [redacted] weeks gestation Acute Appendicitis Transfer to [facility]- accepting MD Darnell Level NPO Prepare for OR  Donette Larry, CNM  01/30/2017 2:08 PM

## 2017-01-30 NOTE — ED Notes (Signed)
OR called- coming to get patient now, per [place] from [place]

## 2017-01-30 NOTE — ED Notes (Signed)
Surgeon at bedside.  

## 2017-01-30 NOTE — MAU Note (Signed)
Pt states she was here yesterday for contractions. States the pain is worse. Pt also complains of nausea, vomiting and some constipation-last BM yesterday. Feels like she has a fever but has not checked temperature. Reports some spotting but cervix was examined yesterday and she was closed. Reports fetal movement although less than usual.

## 2017-01-30 NOTE — ED Notes (Signed)
OB Rapid Response nurse called, per Dr. Gerrit Friends.

## 2017-01-30 NOTE — Progress Notes (Signed)
Called to come do fetal monitoring on a G4P3 33.[redacted] weeks pregnant having an appendectomy at [facility] OR  FHR pre and post procedure was reassuring. Dr Emelda Fear informed. Pt to be transferred back to [facility] RM 310

## 2017-01-30 NOTE — Anesthesia Postprocedure Evaluation (Signed)
Anesthesia Post Note  Patient: Museum/gallery exhibitions officer  Procedure(s) Performed: APPENDECTOMY (N/A )     Patient location during evaluation: PACU Anesthesia Type: General Level of consciousness: awake, awake and alert and oriented Pain management: pain level controlled Vital Signs Assessment: post-procedure vital signs reviewed and stable Respiratory status: spontaneous breathing, nonlabored ventilation and respiratory function stable Cardiovascular status: blood pressure returned to baseline Anesthetic complications: no    Last Vitals:  Vitals:   01/30/17 1715 01/30/17 1730  BP: (!) 112/54 (!) 107/55  Pulse: (!) 121 (!) 118  Resp: (!) 23 19  Temp:  37.4 C  SpO2: 97% 96%    Last Pain:  Vitals:   01/30/17 1815  TempSrc:   PainSc: 10-Worst pain ever                 Kinzey Sheriff COKER

## 2017-01-30 NOTE — Anesthesia Preprocedure Evaluation (Signed)
Anesthesia Evaluation  Patient identified by MRN, date of birth, ID band Patient awake    Reviewed: Allergy & Precautions, NPO status , Patient's Chart, lab work & pertinent test results  Airway Mallampati: II  TM Distance: >3 FB Neck ROM: Full    Dental  (+) Dental Advisory Given, Teeth Intact   Pulmonary    breath sounds clear to auscultation       Cardiovascular  Rhythm:Regular Rate:Normal     Neuro/Psych    GI/Hepatic   Endo/Other    Renal/GU      Musculoskeletal   Abdominal   Peds  Hematology   Anesthesia Other Findings   Reproductive/Obstetrics                             Anesthesia Physical Anesthesia Plan  ASA: III  Anesthesia Plan: General   Post-op Pain Management:    Induction: Rapid sequence and Cricoid pressure planned  PONV Risk Score and Plan: Ondansetron  Airway Management Planned: Oral ETT  Additional Equipment:   Intra-op Plan:   Post-operative Plan: Extubation in OR  Informed Consent: I have reviewed the patients History and Physical, chart, labs and discussed the procedure including the risks, benefits and alternatives for the proposed anesthesia with the patient or authorized representative who has indicated his/her understanding and acceptance.   Dental advisory given  Plan Discussed with: CRNA and Anesthesiologist  Anesthesia Plan Comments:         Anesthesia Quick Evaluation

## 2017-01-30 NOTE — Brief Op Note (Signed)
01/30/2017  4:56 PM  PATIENT:  [name]  26 y.o. female  PRE-OPERATIVE DIAGNOSIS:  acute appendicitis  POST-OPERATIVE DIAGNOSIS:  acute appendicitis  PROCEDURE:  Procedure(s): APPENDECTOMY (N/A)  SURGEON:  Dr. Darnell Level  ASSISTANTS: Dr. Angelena Form   ANESTHESIA:   general  EBL:  No intake/output data recorded.  BLOOD ADMINISTERED:none  DRAINS: none   LOCAL MEDICATIONS USED:  NONE  SPECIMEN:  Excision  DISPOSITION OF SPECIMEN:  PATHOLOGY  COUNTS:  YES  TOURNIQUET:  * No tourniquets in log *  DICTATION: .Other Dictation: Dictation Number [id number]  PLAN OF CARE: Patient to transfer via [facility] to [facility]   PATIENT DISPOSITION:  PACU - hemodynamically stable.   Delay start of Pharmacological VTE agent (>24hrs) due to surgical blood loss or risk of bleeding: yes  Darnell Level, MD [facility] Surgery Office: [phone]

## 2017-01-30 NOTE — Transfer of Care (Signed)
Immediate Anesthesia Transfer of Care Note  Patient: Museum/gallery exhibitions officer  Procedure(s) Performed: APPENDECTOMY (N/A )  Patient Location: PACU  Anesthesia Type:General  Level of Consciousness: awake, alert  and oriented  Airway & Oxygen Therapy: Patient Spontanous Breathing and Patient connected to face mask oxygen  Post-op Assessment: Report given to RN and Post -op Vital signs reviewed and stable  Post vital signs: Reviewed and stable  Last Vitals:  Vitals:   01/30/17 1530 01/30/17 1545  BP:    Pulse: (!) 123 (!) 127  Resp: 20 20  Temp:    SpO2: 97% 98%    Last Pain:  Vitals:   01/30/17 1435  TempSrc:   PainSc: 9       Patients Stated Pain Goal: 4 (01/30/17 1254)  Complications: No apparent anesthesia complications

## 2017-01-30 NOTE — Progress Notes (Signed)
Pharmacy Antibiotic Note  [name] is a 26 y.o. female admitted on 01/30/2017 with appendicitis..  She is [redacted] weeks pregnant & s/p appendectomy today.  Pharmacy has been consulted for Zosyn dosing. Received 1st dose pre-op. Excellent renal function.  Plan: Zosyn 3.375g IV q8h (4 hour infusion).  No dose adjustments anticipated.  Pharmacy will sign off.   Height:  (157.5 cm) Weight: 195 lb (88.5 kg) IBW/kg (Calculated) : 50.1  Temp (24hrs), Avg:99.1 F (37.3 C), Min:98.4 F (36.9 C), Max:100.2 F (37.9 C)   Recent Labs Lab 01/30/17 0407  WBC 21.2*  CREATININE 0.34*    Estimated Creatinine Clearance: 109.2 mL/min (A) (by C-G formula based on SCr of 0.34 mg/dL (L)).    Allergies  Allergen Reactions  . Avocado Anaphylaxis  . Norco [Hydrocodone-Acetaminophen] Nausea Only    dizziness  . Strawberry Extract Hives, Shortness Of Breath and Swelling  . Fentanyl-Bupivacaine-Nacl Rash    Pt states epideral caused rash all over body    Antimicrobials this admission: 10/5 Zosyn >>   Microbiology results: none  Thank you for allowing pharmacy to be a part of this patient's care.  Elson Clan 01/30/2017 7:58 PM

## 2017-01-31 ENCOUNTER — Encounter (HOSPITAL_COMMUNITY): Payer: Self-pay | Admitting: Surgery

## 2017-01-31 LAB — CULTURE, BETA STREP (GROUP B ONLY)

## 2017-01-31 LAB — BASIC METABOLIC PANEL
Anion gap: 10 (ref 5–15)
CHLORIDE: 110 mmol/L (ref 101–111)
CO2: 14 mmol/L — AB (ref 22–32)
Calcium: 8.1 mg/dL — ABNORMAL LOW (ref 8.9–10.3)
Creatinine, Ser: 0.39 mg/dL — ABNORMAL LOW (ref 0.44–1.00)
GFR calc Af Amer: >60 mL/min (ref >60)
GFR calc non Af Amer: >60 mL/min (ref >60)
GLUCOSE: 108 mg/dL — AB (ref 65–99)
POTASSIUM: 3.2 mmol/L — AB (ref 3.5–5.1)
Sodium: 134 mmol/L — ABNORMAL LOW (ref 135–145)

## 2017-01-31 LAB — CBC WITH DIFFERENTIAL/PLATELET
Basophils Absolute: 0 10*3/uL (ref 0.0–0.1)
Basophils Relative: 0 %
Eosinophils Absolute: 0 10*3/uL (ref 0.0–0.7)
Eosinophils Relative: 0 %
HEMATOCRIT: 28.3 % — AB (ref 36.0–46.0)
HEMOGLOBIN: 9.6 g/dL — AB (ref 12.0–15.0)
LYMPHS ABS: 2.8 10*3/uL (ref 0.7–4.0)
LYMPHS PCT: 14 %
MCH: 29.1 pg (ref 26.0–34.0)
MCHC: 33.9 g/dL (ref 30.0–36.0)
MCV: 85.8 fL (ref 78.0–100.0)
MONOS PCT: 5 %
Monocytes Absolute: 1 10*3/uL (ref 0.1–1.0)
NEUTROS PCT: 81 %
Neutro Abs: 16.7 10*3/uL — ABNORMAL HIGH (ref 1.7–7.7)
Platelets: 237 10*3/uL (ref 150–400)
RBC: 3.3 MIL/uL — AB (ref 3.87–5.11)
RDW: 13.1 % (ref 11.5–15.5)
WBC: 20.6 10*3/uL — AB (ref 4.0–10.5)

## 2017-01-31 LAB — AMNISURE RUPTURE OF MEMBRANE (ROM) NOT AT ARMC: AMNISURE: NEGATIVE

## 2017-01-31 MED ORDER — AMOXICILLIN-POT CLAVULANATE 875-125 MG PO TABS: 1.0000 | ORAL_TABLET | Freq: Two times a day (BID) | ORAL | 0 refills | Status: AC

## 2017-01-31 MED ORDER — OXYCODONE HCL 5 MG PO TABS
2.5000 mg | ORAL_TABLET | ORAL | Status: DC | PRN
  Administered 2017-01-31 (×2): 5 mg via ORAL
  Filled 2017-01-31 (×3): qty 1

## 2017-01-31 MED ORDER — OXYCODONE HCL 5 MG PO TABS: 2.5000 mg | ORAL_TABLET | ORAL | 0 refills | Status: DC | PRN

## 2017-01-31 NOTE — Op Note (Signed)
NAMEMarland Kitchen  [name], SAUVE NO.:  [id number]  MEDICAL RECORD NO.:  [id number]  LOCATION:  WA09                         FACILITY:  [facility]  PHYSICIAN:  Velora Heckler, MD      DATE OF BIRTH:  01/10/1991  DATE OF PROCEDURE:  01/30/2017                              OPERATIVE REPORT   PREOPERATIVE DIAGNOSIS:  Acute appendicitis.  POSTOPERATIVE DIAGNOSIS:  Acute appendicitis.  PROCEDURE:  Open appendectomy.  SURGEON:  Velora Heckler, MD.  ASSISTANT:  Marin Olp, MD  ANESTHESIA:  General per Dr. Kipp Brood.  ESTIMATED BLOOD LOSS:  Minimal.  PREPARATION:  ChloraPrep.  COMPLICATIONS:  None.  INDICATIONS:  The patient is a 26 year old, Hispanic female, who is 7 weeks estimated gestational age with her 4th pregnancy.  She developed signs and symptoms of acute appendicitis.  White blood cell count was elevated at 21,000.  CT scan of the abdomen and pelvis showed findings consistent with acute appendicitis.  The patient was transported to [facility] and prepared for the operating room.  BODY OF REPORT:  Procedure was done in OR #4 at the [facility].  The patient was brought to the operating room and placed in supine position on the operating room table.  Following administration of general anesthesia, the patient was positioned and then prepped and draped in the usual aseptic fashion.  After ascertaining that an adequate level of anesthesia had been achieved, an incision was made in the right mid abdominal wall with a #10 blade. Dissection was carried through subcutaneous tissues and hemostasis achieved with the electrocautery.  External oblique fascia was incised. Muscles were split.  Internal oblique fascia and peritoneum were elevated and incised, and the peritoneal cavity was entered cautiously. There was cloudy fluid present within the peritoneal cavity.  This was evacuated.  Colon was identified and elevated.  Appendix was  identified and gently mobilized with blunt dissection.  Appendix was delivered up and into the wound.  It was grasped within a Babcock clamp.  The mesoappendix was then divided with the Harmonic scalpel.  Dissection was carried down through the base of the appendix.  Base of the appendix was then closed with a TA-30 stapler and the appendix was excised and submitted to Pathology as specimen.  Abdomen was irrigated with warm saline.  Good hemostasis was noted. Fluid was evacuated from all quadrants.  Peritoneal layer was closed with a running 0 Vicryl suture.  External oblique fascia was closed with a running 0 Vicryl suture.  Subcutaneous tissues were irrigated.  Skin was closed with stainless steel staples.  Sterile dressings were applied.  The patient was awakened from anesthesia.  The patient remained stable throughout the operation.  The patient will be transported to the recovery room.  Rapid Response from [facility] was present to monitor the fetus.  The patient will be transported via [facility] back to [facility] for postoperative care.  The patient tolerated the procedure well.   Darnell Level, MD [facility] Surgery Office: [phone]    TMG/MEDQ  D:  01/30/2017  T:  01/31/2017  Job:  [id number]  cc:   Horton Chin, MD

## 2017-01-31 NOTE — Progress Notes (Signed)
FACULTY PRACTICE ANTEPARTUM(COMPREHENSIVE) NOTE  [name] is a 26 y.o. G4P3003 at [redacted]w[redacted]d by  who is admitted for Acute Appendicitis, s/p open appendectomy yesterday.   Fetal presentation is cephalic. Length of Stay:  1  Days  Subjective: Pt had some contractions last night, was placed on single dose procardia xl. Now feeling no contractions and toco shows only one mild contraction q 15 mins, not felt by pt. Patient reports the fetal movement as active. Patient reports uterine contraction  activity as irregular, every 15 minutes. Very mild, not felt by ptl Patient reports  vaginal bleeding as none. Patient describes fluid per vagina as None.  Vitals:  Blood pressure (!) 101/49, pulse (!) 106, temperature 98 F (36.7 C), temperature source Oral, resp. rate 18, height  (1.575 m), weight 195 lb (88.5 kg), last menstrual period 06/10/2016, SpO2 97 %, unknown if currently breastfeeding. Physical Examination:  General appearance - alert, well appearing, and in no distress, oriented to person, place, and time and overweight Heart - normal rate and regular rhythm Abdomen - soft, nontender, nondistended Fundal Height:  size equals dates Cervical Exam: Not evaluated.   Extremities: extremities normal, atraumatic, no cyanosis or edema and Homans sign is negative, no sign of DVT with DTRs 2+ bilaterally Membranes:intact  Fetal Monitoring:  Normal q shift, Toco continuous.   Labs:  No results found for this or any previous visit (from the past 24 hour(s)).  Imaging Studies:     Currently EPIC will not allow sonographic studies to automatically populate into notes.  In the meantime, copy and paste results into note or free text.  Medications:  Scheduled . betamethasone acetate-betamethasone sodium phosphate  12 mg Intramuscular Q24 Hr x 2  . NIFEdipine  30 mg Oral BID  . prenatal multivitamin  1 tablet Oral Q1200   I have reviewed the patient's current  medications.  ASSESSMENT: Patient Active Problem List   Diagnosis Date Noted  . Appendicitis 01/30/2017  . Preterm uterine contractions in third trimester, antepartum 01/29/2017  . Bacterial vaginitis 01/29/2017  . Supervision of other normal pregnancy, antepartum 09/09/2016  . History of cesarean delivery, currently pregnant 09/09/2016  . Abdominal pain during pregnancy in first trimester 07/18/2016  . Depression with anxiety 03/23/2012    PLAN: D/c toco I do not feel further procardia required D/c as decided by [facility] F/u [facility] clinic within a week.  Tilda Burrow 01/31/2017,8:41 AM    Patient ID: [name], female   DOB: [birth year], 25 y.o.   MRN: [id number]

## 2017-01-31 NOTE — Progress Notes (Signed)
1 Day Post-Op   Subjective/Chief Complaint: No n/v.  Pain OK.  Tolerating diet.     Objective: Vital signs in last 24 hours: Temp:  [98 F (36.7 C)-99.3 F (37.4 C)] 98 F (36.7 C) (10/06 0753) Pulse Rate:  [106-127] 106 (10/06 0753) Resp:  [15-28] 18 (10/06 0753) BP: (98-119)/(47-63) 101/49 (10/06 0753) SpO2:  [94 %-100 %] 97 % (10/06 0753) Last BM Date: 01/28/17  Intake/Output from previous day: 10/05 0701 - 10/06 0700 In: 700 [I.V.:700] Out: 0  Intake/Output this shift: Total I/O In: 240 [P.O.:240] Out: 600 [Urine:600]  General appearance: alert, cooperative and mild distress Resp: breathing comfortably GI: soft, gravid.  dressing c/d/i.  approp tender.   Extremities: tr edema.  Lab Results:   Recent Labs  01/30/17 0407  WBC 21.2*  HGB 11.2*  HCT 32.3*  PLT 248   BMET  Recent Labs  01/30/17 0407  NA 134*  K 3.5  CL 106  CO2 17*  GLUCOSE 93  BUN 5*  CREATININE 0.34*  CALCIUM 8.6*   PT/INR No results for input(s): LABPROT, INR in the last 72 hours. ABG No results for input(s): PHART, HCO3 in the last 72 hours.  Invalid input(s): PCO2, PO2  Studies/Results: Ct Abdomen Pelvis W Contrast  Result Date: 01/30/2017 CLINICAL DATA:  Vomiting.  Right lower quadrant pain EXAM: CT ABDOMEN AND PELVIS WITH CONTRAST TECHNIQUE: Multidetector CT imaging of the abdomen and pelvis was performed using the standard protocol following bolus administration of intravenous contrast. CONTRAST:  ISOVUE-300 IOPAMIDOL (ISOVUE-300) INJECTION 61% COMPARISON:  11/13/2013 FINDINGS: Lower chest: Mild subsegmental atelectasis within the lung bases noted. Hepatobiliary: No focal liver abnormality. Previous cholecystectomy. Pancreas: Unremarkable appearance of the pancreas. Spleen: Spleen appears normal. Adrenals/Urinary Tract: The adrenal glands are normal. Normal appearance of both kidneys. Urinary bladder appears collapsed. Stomach/Bowel: The stomach is normal. The small bowel  loops are unremarkable. The appendix is visualized within the right upper quadrant of the abdomen. The appendix is increased in caliber measuring 2 cm and maximum diameter. There is diffuse periappendiceal fat stranding compatible with inflammation. There is a small foci of gas associated with the mid appendix. Cannot confirm intraluminal location of this gas. The colon is unremarkable. Vascular/Lymphatic: No significant vascular findings are present. No enlarged abdominal or pelvic lymph nodes. Reproductive: There is a gravid uterus compatible with [redacted] week gestation. Other: No free fluid or fluid collections. Musculoskeletal: The visualized osseous structures are unremarkable. IMPRESSION: 1. Exam positive for acute appendicitis. No free perforation and no abscess identified. There is a small foci of gas associated with the mid appendix for which intraluminal location cannot be confirmed. The presence of extraluminal gas would be suggestive of perforated appendix. 2. Gravid uterus compatible with [redacted] week gestation. Electronically Signed   By: Signa Kell M.D.   On: 01/30/2017 13:45   [place] Mfm Ob Limited  Result Date: 01/30/2017 ----------------------------------------------------------------------  OBSTETRICS REPORT                      (Signed Final 01/30/2017 10:23 am) ---------------------------------------------------------------------- Patient Info  ID #:       [id number]                          D.O.B.:  10-15-1990 (26 yrs)  Name:       [name]  Visit Date: 01/30/2017 08:46 am              [place] ---------------------------------------------------------------------- Performed By  Performed By:     Emeline Darling BS,      Ref. Address:     [facility]                                                             OB/Gyn Clinic                                                             [address]                                                             [place], [place]                                                             [address]  Attending:        Ledon Snare MD         Location:         [facility]  Referred By:      [facility] for                    [facility]                    Healthcare ---------------------------------------------------------------------- Orders   #  Description                                 Code   1  [place] MFM OB LIMITED                           56387.56  ----------------------------------------------------------------------   #  Ordered By               Order #        Accession #    Episode #   1  Judeth Horn  [id number]      [id number]     [id number]  ---------------------------------------------------------------------- Indications   [redacted] weeks gestation of pregnancy                Z3A.33   History of cesarean delivery, currently        O34.219   pregnant   Obesity complicating pregnancy, second         O99.212   trimester(Pre-preg BMI = 36.2)   Abdominal pain in pregnancy (RLQ pain)         O99.89  ---------------------------------------------------------------------- OB History  Blood Type:            Height:  5'2"   Weight (lb):  192       BMI:  35.11  Gravidity:    4         Term:   3  Living:       3 ---------------------------------------------------------------------- Fetal Evaluation  Num Of Fetuses:     1  Fetal Heart         155  Rate(bpm):  Cardiac Activity:   Observed  Presentation:       Cephalic  Placenta:           Anterior, above cervical os  P. Cord Insertion:  Previously Visualized  Amniotic Fluid  AFI FV:      Subjectively within normal limits  AFI Sum(cm)     %Tile       Largest Pocket(cm)  17.56           64          5.67  RUQ(cm)       RLQ(cm)       LUQ(cm)        LLQ(cm)  5.67          4.67          3.27           3.95 ---------------------------------------------------------------------- Gestational  Age  LMP:           33w 3d        Date:  06/10/16                 EDD:   03/17/17  Best:          33w 3d     Det. By:  LMP  (06/10/16)          EDD:   03/17/17 ---------------------------------------------------------------------- Cervix Uterus Adnexa  Cervix  Not visualized (advanced GA >29wks)  Left Ovary  Size(cm)       3.2  x   1.4    x  2.2       Vol(ml): 5.2  Within normal limits.  Right Ovary  Size(cm)       2.6  x   1.4    x  1.4       Vol(ml): 2.7  Within normal limits.  Adnexa:       No adnexal mass visualized.  Comment:      Dilated vessels in right adnexa have                appearance of  pelvic vascular congestion. ---------------------------------------------------------------------- Impression  Singleton intrauterine pregnancy at 33 weeks 3 days  gestation with fetal cardiac activity  Cephalic presentation  Anterior placenta without evidence of previa  AFI > 17 cm  No sonographic evidence of intrauterine bleed ---------------------------------------------------------------------- Recommendations  Follow-up ultrasounds as clinically indicated. ----------------------------------------------------------------------  Ledon Snare, MD Electronically Signed Final Report   01/30/2017 10:23 am ----------------------------------------------------------------------   Anti-infectives: Anti-infectives    Start     Dose/Rate Route Frequency Ordered Stop   01/30/17 2200  piperacillin-tazobactam (ZOSYN) IVPB 3.375 g     3.375 g 12.5 mL/hr over 240 Minutes Intravenous Every 8 hours 01/30/17 1958     01/30/17 1530  piperacillin-tazobactam (ZOSYN) IVPB 3.375 g     3.375 g 100 mL/hr over 30 Minutes Intravenous  Once 01/30/17 1516 01/30/17 1635   01/30/17 1517  piperacillin-tazobactam (ZOSYN) 3.375 GM/50ML IVPB    Comments:  Wylene Simmer   : cabinet override      01/30/17 1517 01/30/17 1605   01/30/17 1515  piperacillin-tazobactam (ZOSYN) IVPB 3.375 g  Status:  Discontinued     3.375  g 12.5 mL/hr over 240 Minutes Intravenous STAT 01/30/17 1513 01/30/17 1516      Assessment/Plan: s/p Procedure(s): APPENDECTOMY (N/A) oral pain meds.  Reg diet D/c with antibiotics Await labs.  D/c home if labs ok and if tolerates oral pain meds.     LOS: 1 day    [facility] 01/31/2017

## 2017-01-31 NOTE — Progress Notes (Signed)
Faculty Practice OB/GYN Attending Note   26 y.o. O1H0865 at [redacted]w[redacted]d POD#1 s/p appendectomy. Still having significant pain.  Reassuring FHR pattern. Will continue close observation and plan for discharge tomorrow morning.  Jaynie Collins, MD, FACOG Attending Obstetrician & Gynecologist Faculty Practice, [facility]

## 2017-02-01 DIAGNOSIS — Z9049 Acquired absence of other specified parts of digestive tract: Secondary | ICD-10-CM

## 2017-02-01 MED ORDER — NIFEDIPINE ER 30 MG PO TB24: 30.0000 mg | ORAL_TABLET | Freq: Every day | ORAL | 2 refills | Status: AC

## 2017-02-01 NOTE — Progress Notes (Signed)
Pt out in wheelchair teaching complete  

## 2017-02-01 NOTE — Discharge Instructions (Signed)
Open Appendectomy, Care After Refer to this sheet in the next few weeks. These instructions provide you with information about caring for yourself after your procedure. Your health care provider may also give you more specific instructions. Your treatment has been planned according to current medical practices, but problems sometimes occur. Call your health care provider if you have any problems or questions after your procedure. What can I expect after the procedure? After the procedure, it is common to have:  A decrease in your energy level.  Pain in the area where the surgical cut (incision) was made.  Constipation. This can be caused by pain medicine and a decrease in your activity.  Follow these instructions at home: Medicines  Take over-the-counter and prescription medicines only as told by your health care provider.  Do not drive for 24 hours if you received a sedative.  Do not drive or operate heavy machinery while taking prescription pain medicine.  If you were prescribed an antibiotic medicine, take it as told by your health care provider. Do not stop taking the antibiotic even if you start to feel better. Activity  Gradually return to your regular activities.  For 3 weeks or as long as told by your health care provider: ? Do not lift anything that is heavier than 10 lb (4.5 kg). ? Do not play contact sports. Bathing  Keep your incision clean and dry. ? Gently wash the incision with soap and water. ? Rinse the incision with water to remove all soap. ? Pat the incision dry with a clean towel. Do not rub the incision.  You may take showers after 48 hours.  Do not take baths, swim, or use hot tubs for 2 weeks or as long as told by your health care provider. Incision care  Follow instructions from your health care provider about caring for your incision. Make sure you: ? Wash your hands with soap and water before you change your bandage (dressing). If soap and water are  not available, use hand sanitizer. ? Change your dressing as told by your health care provider. ? Leave stitches (sutures), skin glue, or adhesive strips in place. In some cases, these skin closures may need to be in place for 2 weeks or longer. If adhesive strip edges start to loosen and curl up, you may trim the loose edges. Do not remove adhesive strips entirely unless your health care provider tells you to remove them.  Check your incision area every day for signs of infection. Check for: ? More redness, swelling, or pain. ? More fluid or blood. ? Warmth. ? Pus or a bad smell. Other Instructions  If you were sent home with a drain, follow instructions from your health care provider about how to care for the drain and how to empty it.  Take deep breaths. This helps to prevent your lungs from becoming inflamed.  To relieve and prevent constipation: ? Drink plenty of fluids. ? Eat plenty of fruits and vegetables.  Keep all follow-up visits as told by your health care provider. This is important. Contact a health care provider if:  You have more redness, swelling, or pain at the site of your incision.  You have more fluid or blood coming from your incision.  Your incision feels warm to the touch.  You have pus or a bad smell coming from your incision or dressing.  Your incision edges break open after your sutures have been removed.  You have increasing pain in your shoulders.  You feel dizzy or you faint.  You develop shortness of breath.  You keep feeling nauseous or you are vomiting.  You have diarrhea or you cannot control your bowel functions.  You lose your appetite.  You develop swelling or pain in your legs. Get help right away if:  You have a fever.  You develop a rash.  You have difficulty breathing.  You have sharp pains in your chest. This information is not intended to replace advice given to you by your health care provider. Make sure you discuss any  questions you have with your health care provider. Document Released: 11/27/2003 Document Revised: 09/17/2015 Document Reviewed: 10/02/2014 Elsevier Interactive Patient Education  2018 [employer].    [employer] of the uterus can occur throughout pregnancy, but they are not always a sign that you are in labor. You may have practice contractions called Braxton Hicks contractions. These false labor contractions are sometimes confused with true labor. What are Deberah Pelton contractions? Braxton Hicks contractions are tightening movements that occur in the muscles of the uterus before labor. Unlike true labor contractions, these contractions do not result in opening (dilation) and thinning of the cervix. Toward the end of pregnancy (32-34 weeks), Braxton Hicks contractions can happen more often and may become stronger. These contractions are sometimes difficult to tell apart from true labor because they can be very uncomfortable. You should not feel embarrassed if you go to the hospital with false labor. Sometimes, the only way to tell if you are in true labor is for your health care provider to look for changes in the cervix. The health care provider will do a physical exam and may monitor your contractions. If you are not in true labor, the exam should show that your cervix is not dilating and your water has not broken. If there are no prenatal problems or other health problems associated with your pregnancy, it is completely safe for you to be sent home with false labor. You may continue to have Braxton Hicks contractions until you go into true labor. How can I tell the difference between true labor and false labor?  Differences ? False labor ? Contractions last 30-70 seconds.: Contractions are usually shorter and not as strong as true labor contractions. ? Contractions become very regular.: Contractions are usually irregular. ? Discomfort is usually felt in the top  of the uterus, and it spreads to the lower abdomen and low back.: Contractions are often felt in the front of the lower abdomen and in the groin. ? Contractions do not go away with walking.: Contractions may go away when you walk around or change positions while lying down. ? Contractions usually become more intense and increase in frequency.: Contractions get weaker and are shorter-lasting as time goes on. ? The cervix dilates and gets thinner.: The cervix usually does not dilate or become thin. Follow these instructions at home:  Take over-the-counter and prescription medicines only as told by your health care provider.  Keep up with your usual exercises and follow other instructions from your health care provider.  Eat and drink lightly if you think you are going into labor.  If Braxton Hicks contractions are making you uncomfortable: ? Change your position from lying down or resting to walking, or change from walking to resting. ? Sit and rest in a tub of warm water. ? Drink enough fluid to keep your urine clear or pale yellow. Dehydration may cause these contractions. ? Do slow and deep  breathing several times an hour.  Keep all follow-up prenatal visits as told by your health care provider. This is important. Contact a health care provider if:  You have a fever.  You have continuous pain in your abdomen. Get help right away if:  Your contractions become stronger, more regular, and closer together.  You have fluid leaking or gushing from your vagina.  You pass blood-tinged mucus (bloody show).  You have bleeding from your vagina.  You have low back pain that you never had before.  You feel your babys head pushing down and causing pelvic pressure.  Your baby is not moving inside you as much as it used to. Summary  Contractions that occur before labor are called Braxton Hicks contractions, false labor, or practice contractions.  Braxton Hicks contractions are usually  shorter, weaker, farther apart, and less regular than true labor contractions. True labor contractions usually become progressively stronger and regular and they become more frequent.  Manage discomfort from [facility] contractions by changing position, resting in a warm bath, drinking plenty of water, or practicing deep breathing. This information is not intended to replace advice given to you by your health care provider. Make sure you discuss any questions you have with your health care provider. Document Released: 04/14/2005 Document Revised: 03/03/2016 Document Reviewed: 03/03/2016 Elsevier Interactive Patient Education  2017 [employer].

## 2017-02-01 NOTE — Discharge Summary (Signed)
Physician Discharge Summary  Patient ID: [name] MRN: [id number] DOB/AGE: January 09, 1991 26 y.o.  Admit date: 01/30/2017 Discharge date: 02/01/2017  Admission and Discharge Diagnoses:  Principal Problem:   Acute appendicitis affecting pregnancy Active Problems:   Preterm uterine contractions in third trimester, antepartum   S/P appendectomy   Discharged Condition: stable  Hospital Course: Patient was admitted with acute appendicitis at [redacted]w[redacted]d, underwent uncomplicated open appendectomy by General Surgery. Had some preterm contractions, controlled on Procardia XL. Was given betamethasone regimen.  Treated with Zosyn, transitioned to Augmentin on POD#1.  FHR remained reassuring, no further maternal-fetal concerns. She thought she had some LOF, Amnisure negative on 01/31/17, no signs/symptoms of PTL. Deemed stable for discharge to home on 02/01/2017.  Consults: General Surgery  Significant Diagnostic Studies:  Results for orders placed or performed during the hospital encounter of 01/30/17 (from the past 168 hour(s))  Urinalysis, Routine w reflex microscopic   Collection Time: 01/30/17  3:23 AM  Result Value Ref Range   Color, Urine YELLOW YELLOW   APPearance CLEAR CLEAR   Specific Gravity, Urine 1.017 1.005 - 1.030   pH 6.0 5.0 - 8.0   Glucose, UA NEGATIVE NEGATIVE mg/dL   Hgb urine dipstick NEGATIVE NEGATIVE   Bilirubin Urine NEGATIVE NEGATIVE   Ketones, ur 80 (A) NEGATIVE mg/dL   Protein, ur NEGATIVE NEGATIVE mg/dL   Nitrite NEGATIVE NEGATIVE   Leukocytes, UA NEGATIVE NEGATIVE  CBC   Collection Time: 01/30/17  4:07 AM  Result Value Ref Range   WBC 21.2 (H) 4.0 - 10.5 K/uL   RBC 3.84 (L) 3.87 - 5.11 MIL/uL   Hemoglobin 11.2 (L) 12.0 - 15.0 g/dL   HCT 21.3 (L) 08.6 - 57.8 %   MCV 84.1 78.0 - 100.0 fL   MCH 29.2 26.0 - 34.0 pg   MCHC 34.7 30.0 - 36.0 g/dL   RDW 46.9 62.9 - 52.8 %   Platelets 248 150 - 400 K/uL  Basic metabolic panel   Collection Time: 01/30/17   4:07 AM  Result Value Ref Range   Sodium 134 (L) 135 - 145 mmol/L   Potassium 3.5 3.5 - 5.1 mmol/L   Chloride 106 101 - 111 mmol/L   CO2 17 (L) 22 - 32 mmol/L   Glucose, Bld 93 65 - 99 mg/dL   BUN 5 (L) 6 - 20 mg/dL   Creatinine, Ser 4.13 (L) 0.44 - 1.00 mg/dL   Calcium 8.6 (L) 8.9 - 10.3 mg/dL   GFR calc non Af Amer >60 >60 mL/min   GFR calc Af Amer >60 >60 mL/min   Anion gap 11 5 - 15  Differential   Collection Time: 01/30/17  4:07 AM  Result Value Ref Range   Neutrophils Relative % 83 %   Neutro Abs 17.4 (H) 1.7 - 7.7 K/uL   Lymphocytes Relative 10 %   Lymphs Abs 2.1 0.7 - 4.0 K/uL   Monocytes Relative 7 %   Monocytes Absolute 1.4 (H) 0.1 - 1.0 K/uL   Eosinophils Relative 0 %   Eosinophils Absolute 0.0 0.0 - 0.7 K/uL   Basophils Relative 0 %   Basophils Absolute 0.0 0.0 - 0.1 K/uL  AST   Collection Time: 01/30/17  4:07 AM  Result Value Ref Range   AST 22 15 - 41 U/L  ALT   Collection Time: 01/30/17  4:07 AM  Result Value Ref Range   ALT 17 14 - 54 U/L  Lipase, blood   Collection Time: 01/30/17  4:07 AM  Result Value Ref Range   Lipase 17 11 - 51 U/L  CBC with Differential/Platelet   Collection Time: 01/31/17 11:53 AM  Result Value Ref Range   WBC 20.6 (H) 4.0 - 10.5 K/uL   RBC 3.30 (L) 3.87 - 5.11 MIL/uL   Hemoglobin 9.6 (L) 12.0 - 15.0 g/dL   HCT 16.1 (L) 09.6 - 04.5 %   MCV 85.8 78.0 - 100.0 fL   MCH 29.1 26.0 - 34.0 pg   MCHC 33.9 30.0 - 36.0 g/dL   RDW 40.9 81.1 - 91.4 %   Platelets 237 150 - 400 K/uL   Neutrophils Relative % 81 %   Neutro Abs 16.7 (H) 1.7 - 7.7 K/uL   Lymphocytes Relative 14 %   Lymphs Abs 2.8 0.7 - 4.0 K/uL   Monocytes Relative 5 %   Monocytes Absolute 1.0 0.1 - 1.0 K/uL   Eosinophils Relative 0 %   Eosinophils Absolute 0.0 0.0 - 0.7 K/uL   Basophils Relative 0 %   Basophils Absolute 0.0 0.0 - 0.1 K/uL  Basic metabolic panel   Collection Time: 01/31/17 11:53 AM  Result Value Ref Range   Sodium 134 (L) 135 - 145 mmol/L    Potassium 3.2 (L) 3.5 - 5.1 mmol/L   Chloride 110 101 - 111 mmol/L   CO2 14 (L) 22 - 32 mmol/L   Glucose, Bld 108 (H) 65 - 99 mg/dL   BUN <5 (L) 6 - 20 mg/dL   Creatinine, Ser 7.82 (L) 0.44 - 1.00 mg/dL   Calcium 8.1 (L) 8.9 - 10.3 mg/dL   GFR calc non Af Amer >60 >60 mL/min   GFR calc Af Amer >60 >60 mL/min   Anion gap 10 5 - 15  Amnisure rupture of membrane (rom)not at [facility]   Collection Time: 01/31/17  9:39 PM  Result Value Ref Range   Amnisure ROM NEGATIVE   Results for orders placed or performed during the hospital encounter of 01/29/17 (from the past 168 hour(s))  GC/Chlamydia probe amp ([place])not at [facility]   Collection Time: 01/29/17 12:00 AM  Result Value Ref Range   Chlamydia Negative    Neisseria gonorrhea Negative   Urinalysis, Routine w reflex microscopic   Collection Time: 01/29/17  2:08 PM  Result Value Ref Range   Color, Urine YELLOW YELLOW   APPearance HAZY (A) CLEAR   Specific Gravity, Urine 1.013 1.005 - 1.030   pH 7.0 5.0 - 8.0   Glucose, UA NEGATIVE NEGATIVE mg/dL   Hgb urine dipstick NEGATIVE NEGATIVE   Bilirubin Urine NEGATIVE NEGATIVE   Ketones, ur 20 (A) NEGATIVE mg/dL   Protein, ur NEGATIVE NEGATIVE mg/dL   Nitrite NEGATIVE NEGATIVE   Leukocytes, UA SMALL (A) NEGATIVE   RBC / HPF 0-5 0 - 5 RBC/hpf   WBC, UA 0-5 0 - 5 WBC/hpf   Bacteria, UA FEW (A) NONE SEEN   Squamous Epithelial / LPF 0-5 (A) NONE SEEN   Mucus PRESENT   Wet prep, genital   Collection Time: 01/29/17  2:50 PM  Result Value Ref Range   Yeast Wet Prep HPF POC NONE SEEN NONE SEEN   Trich, Wet Prep NONE SEEN NONE SEEN   Clue Cells Wet Prep HPF POC PRESENT (A) NONE SEEN   WBC, Wet Prep HPF POC MANY (A) NONE SEEN   Sperm NONE SEEN   Culture, beta strep (group b only)   Collection Time: 01/29/17  2:50 PM  Result Value Ref Range   Specimen Description  VAGINAL/RECTAL    Special Requests NONE    Culture      NO GROUP B STREP (S.AGALACTIAE) ISOLATED Performed at [facility]  Lab, [address], [place], [place] [address]    Report Status 01/31/2017 FINAL   Fetal fibronectin   Collection Time: 01/29/17  2:50 PM  Result Value Ref Range   Fetal Fibronectin NEGATIVE NEGATIVE  Glucose, capillary   Collection Time: 01/29/17  4:51 PM  Result Value Ref Range   Glucose-Capillary 72 65 - 99 mg/dL    Ct Abdomen Pelvis W Contrast  Result Date: 01/30/2017 CLINICAL DATA:  Vomiting.  Right lower quadrant pain EXAM: CT ABDOMEN AND PELVIS WITH CONTRAST TECHNIQUE: Multidetector CT imaging of the abdomen and pelvis was performed using the standard protocol following bolus administration of intravenous contrast. CONTRAST:  ISOVUE-300 IOPAMIDOL (ISOVUE-300) INJECTION 61% COMPARISON:  11/13/2013 FINDINGS: Lower chest: Mild subsegmental atelectasis within the lung bases noted. Hepatobiliary: No focal liver abnormality. Previous cholecystectomy. Pancreas: Unremarkable appearance of the pancreas. Spleen: Spleen appears normal. Adrenals/Urinary Tract: The adrenal glands are normal. Normal appearance of both kidneys. Urinary bladder appears collapsed. Stomach/Bowel: The stomach is normal. The small bowel loops are unremarkable. The appendix is visualized within the right upper quadrant of the abdomen. The appendix is increased in caliber measuring 2 cm and maximum diameter. There is diffuse periappendiceal fat stranding compatible with inflammation. There is a small foci of gas associated with the mid appendix. Cannot confirm intraluminal location of this gas. The colon is unremarkable. Vascular/Lymphatic: No significant vascular findings are present. No enlarged abdominal or pelvic lymph nodes. Reproductive: There is a gravid uterus compatible with [redacted] week gestation. Other: No free fluid or fluid collections. Musculoskeletal: The visualized osseous structures are unremarkable. IMPRESSION: 1. Exam positive for acute appendicitis. No free perforation and no abscess identified. There is a small  foci of gas associated with the mid appendix for which intraluminal location cannot be confirmed. The presence of extraluminal gas would be suggestive of perforated appendix. 2. Gravid uterus compatible with [redacted] week gestation. Electronically Signed   By: Signa Kell M.D.   On: 01/30/2017 13:45   [place] Mfm Ob Limited  Result Date: 01/30/2017 ----------------------------------------------------------------------  OBSTETRICS REPORT                      (Signed Final 01/30/2017 10:23 am) ---------------------------------------------------------------------- Patient Info  ID #:       [id number]                          D.O.B.:  1990-09-24 (26 yrs)  Name:       [name]                         Visit Date: 01/30/2017 08:46 am              [place] ---------------------------------------------------------------------- Performed By  Performed By:     Emeline Darling BS,      Ref. Address:     [facility]                    [facility]                                                             [address]                                                             [place], [place]                                                             [address]  Attending:        Ledon Snare MD         Location:         [facility]  Referred By:      [facility]                    [facility]                    Healthcare ---------------------------------------------------------------------- Orders   #  Description                                 Code   1  [place] MFM OB LIMITED                           81191.47  ----------------------------------------------------------------------   #  Ordered By               Order #        Accession #    Episode #   1  Judeth Horn            [id number]      [id number]     [id number]   ---------------------------------------------------------------------- Indications   [redacted] weeks gestation of pregnancy                Z3A.33   History of cesarean delivery, currently        O34.219   pregnant   Obesity complicating pregnancy, second         O99.212   trimester(Pre-preg BMI = 36.2)   Abdominal pain in pregnancy (RLQ pain)         O99.89  ---------------------------------------------------------------------- OB History  Blood Type:            Height:  5'2"   Weight (  lb):  192       BMI:  35.11  Gravidity:    4         Term:   3  Living:       3 ---------------------------------------------------------------------- Fetal Evaluation  Num Of Fetuses:     1  Fetal Heart         155  Rate(bpm):  Cardiac Activity:   Observed  Presentation:       Cephalic  Placenta:           Anterior, above cervical os  P. Cord Insertion:  Previously Visualized  Amniotic Fluid  AFI FV:      Subjectively within normal limits  AFI Sum(cm)     %Tile       Largest Pocket(cm)  17.56           64          5.67  RUQ(cm)       RLQ(cm)       LUQ(cm)        LLQ(cm)  5.67          4.67          3.27           3.95 ---------------------------------------------------------------------- Gestational Age  LMP:           33w 3d        Date:  06/10/16                 EDD:   03/17/17  Best:          33w 3d     Det. By:  LMP  (06/10/16)          EDD:   03/17/17 ---------------------------------------------------------------------- Cervix Uterus Adnexa  Cervix  Not visualized (advanced GA >29wks)  Left Ovary  Size(cm)       3.2  x   1.4    x  2.2       Vol(ml): 5.2  Within normal limits.  Right Ovary  Size(cm)       2.6  x   1.4    x  1.4       Vol(ml): 2.7  Within normal limits.  Adnexa:       No adnexal mass visualized.  Comment:      Dilated vessels in right adnexa have                appearance of  pelvic vascular congestion. ---------------------------------------------------------------------- Impression  Singleton intrauterine pregnancy at  33 weeks 3 days  gestation with fetal cardiac activity  Cephalic presentation  Anterior placenta without evidence of previa  AFI > 17 cm  No sonographic evidence of intrauterine bleed ---------------------------------------------------------------------- Recommendations  Follow-up ultrasounds as clinically indicated. ----------------------------------------------------------------------                   Ledon Snare, MD Electronically Signed Final Report   01/30/2017 10:23 am ----------------------------------------------------------------------   Treatments: IV hydration and antibiotics: Zosyn >> Augmentin  Discharge Exam: Blood pressure (!) 95/43, pulse (!) 103, temperature 98.2 F (36.8 C), temperature source Oral, resp. rate 15, height 5\' 2"  (1.575 m), weight 195 lb (88.5 kg), last menstrual period 06/10/2016, SpO2 98 %, unknown if currently breastfeeding.  FHR 135 bpm, reactive.  Rare contractions. General appearance: alert and no distress Resp: clear to auscultation bilaterally Cardio: regular rate and rhythm GI: soft, non-tender; bowel sounds normal; no masses,  no organomegaly.  Dressing C/D/I Pelvic: deferred Extremities: extremities normal,  atraumatic, no cyanosis or edema, Homans sign is negative, no sign of DVT and no edema, redness or tenderness in the calves or thighs Pulses: 2+ and symmetric  Disposition: 01-Home or Self Care  Discharge Instructions    Call MD for:  difficulty breathing, headache or visual disturbances    Complete by:  As directed    Call MD for:  persistant nausea and vomiting    Complete by:  As directed    Call MD for:  redness, tenderness, or signs of infection (pain, swelling, redness, odor or green/yellow discharge around incision site)    Complete by:  As directed    Call MD for:  severe uncontrolled pain    Complete by:  As directed    Call MD for:  temperature >100.4    Complete by:  As directed    Change dressing (specify)    Complete by:  As  directed    Remove dressing tomorrow and OK to shower.  NO bath or pool for 2 weeks.   Diet - low sodium heart healthy    Complete by:  As directed    Increase activity slowly    Complete by:  As directed      Allergies as of 02/01/2017      Reactions   Avocado Anaphylaxis   Norco [hydrocodone-acetaminophen] Nausea Only   dizziness   Strawberry Extract Hives, Shortness Of Breath, Swelling   Fentanyl-bupivacaine-nacl Rash   Pt states epideral caused rash all over body      Medication List    TAKE these medications   acetaminophen 500 MG tablet Commonly known as:  TYLENOL Take 1,000 mg by mouth every 6 (six) hours as needed for mild pain, moderate pain or headache.   albuterol 108 (90 Base) MCG/ACT inhaler Commonly known as:  PROVENTIL HFA;VENTOLIN HFA Inhale 2 puffs into the lungs every 6 (six) hours as needed for wheezing or shortness of breath.   amoxicillin-clavulanate 875-125 MG tablet Commonly known as:  AUGMENTIN Take 1 tablet by mouth 2 (two) times daily.   metoCLOPramide 10 MG tablet Commonly known as:  REGLAN Take 1 tablet (10 mg total) by mouth 3 (three) times daily before meals.   metroNIDAZOLE 0.75 % vaginal gel Commonly known as:  METROGEL VAGINAL Place 1 Applicatorful vaginally at bedtime.   NIFEdipine 30 MG 24 hr tablet Commonly known as:  PROCARDIA-XL/ADALAT CC Take 1 tablet (30 mg total) by mouth daily. Can increase to twice a day as needed   oxyCODONE 5 MG immediate release tablet Commonly known as:  Oxy IR/ROXICODONE Take 0.5-1 tablets (2.5-5 mg total) by mouth every 4 (four) hours as needed for moderate pain, severe pain or breakthrough pain.   prenatal multivitamin Tabs tablet Take 1 tablet by mouth daily at 12 noon.   promethazine 25 MG tablet Commonly known as:  PHENERGAN Take 1 tablet (25 mg total) by mouth every 6 (six) hours as needed for nausea or vomiting.            Discharge Care Instructions        Start     Ordered    01/31/17 0000  Change dressing (specify)    Comments:  Remove dressing tomorrow and OK to shower.  NO bath or pool for 2 weeks.   01/31/17 1147     Follow-up Information    [facility] Follow up in 2 week(s).   Specialty:  Obstetrics and Gynecology Why:  You will be called with appointment  Contact information: [address] [place] [address] [phone]          Signed: Jaynie Collins 02/01/2017, 7:55 AM

## 2017-02-04 ENCOUNTER — Ambulatory Visit (INDEPENDENT_AMBULATORY_CARE_PROVIDER_SITE_OTHER): Payer: Self-pay | Admitting: Obstetrics & Gynecology

## 2017-02-04 VITALS — BP 120/64 | HR 93 | Wt 193.0 lb

## 2017-02-04 DIAGNOSIS — K3580 Unspecified acute appendicitis: Secondary | ICD-10-CM

## 2017-02-04 DIAGNOSIS — Z3480 Encounter for supervision of other normal pregnancy, unspecified trimester: Secondary | ICD-10-CM

## 2017-02-04 DIAGNOSIS — O34219 Maternal care for unspecified type scar from previous cesarean delivery: Secondary | ICD-10-CM

## 2017-02-04 DIAGNOSIS — Z9049 Acquired absence of other specified parts of digestive tract: Secondary | ICD-10-CM

## 2017-02-04 DIAGNOSIS — L299 Pruritus, unspecified: Secondary | ICD-10-CM

## 2017-02-04 DIAGNOSIS — O99619 Diseases of the digestive system complicating pregnancy, unspecified trimester: Principal | ICD-10-CM

## 2017-02-04 DIAGNOSIS — O99713 Diseases of the skin and subcutaneous tissue complicating pregnancy, third trimester: Secondary | ICD-10-CM

## 2017-02-04 DIAGNOSIS — O99613 Diseases of the digestive system complicating pregnancy, third trimester: Secondary | ICD-10-CM

## 2017-02-04 NOTE — Patient Instructions (Signed)
Return to clinic for any scheduled appointments or obstetric concerns, or go to [facility] for evaluation  

## 2017-02-04 NOTE — Progress Notes (Signed)
   PRENATAL VISIT NOTE  Subjective:  [name] is a 26 y.o. G4P3003 at 15w1dbeing seen today for ongoing prenatal care and also for incision check after recent appendectomy.  She is currently monitored for the following issues for this low-risk pregnancy and has Depression with anxiety; Abdominal pain during pregnancy in first trimester; Supervision of other normal pregnancy, antepartum; History of cesarean delivery, currently pregnant; Preterm uterine contractions in third trimester, antepartum; Bacterial vaginitis; Acute appendicitis affecting pregnancy; and S/P appendectomy on her problem list.  Patient reports itching on palms and soles, no rash. Thinks it could be drug reaction after her appendectomy. Pain is well controlled.  Contractions: Irregular. Vag. Bleeding: None.  Movement: Present. Denies leaking of fluid.   The following portions of the patient's history were reviewed and updated as appropriate: allergies, current medications, past family history, past medical history, past social history, past surgical history and problem list. Problem list updated.  Objective:   Vitals:   02/04/17 1452  BP: 120/64  Pulse: 93  Weight: 193 lb (87.5 kg)    Fetal Status: Fetal Heart Rate (bpm): 135 Fundal Height: 35 cm Movement: Present     General:  Alert, oriented and cooperative. Patient is in no acute distress.  Skin: Skin is warm and dry. No rash noted.   Cardiovascular: Normal heart rate noted  Respiratory: Normal respiratory effort, no problems with respiration noted  Abdomen: Soft, gravid, appropriate for gestational age.  Pain/Pressure: Present occasionally. Appendectomy incision in right side, C/D/I, no erythema, no drainage, no induration. Staples removed, steristrips placed.     Pelvic: Cervical exam deferred        Extremities: Normal range of motion.  Edema: None  Mental Status:  Normal mood and affect. Normal behavior. Normal judgment and thought content.    01/30/17 Surgical Pathology   Appendix, Other than Incidental ACUTE APPENDICITIS WITH PERI APPENDICEAL ABSCESS  Assessment and Plan:  Pregnancy: G4P3003 at 351w1d1. S/P appendectomy 2. Acute appendicitis affecting pregnancy Doing well. Finishing Augmentin course.  Pain controlled on analgesia prn. Reviewed pathology findings.    3. Pruritus of pregnancy in third trimester Recommended Benadryl, ?allergy to Augmentin or pain meds. Will still evaluate for possible cholestasis. - Comp Met (CMET) - Bile acids, total  4. History of cesarean delivery, currently pregnant Scheduled for RCS and BTS at 39 weeks  5. Supervision of other normal pregnancy, antepartum Preterm labor symptoms and general obstetric precautions including but not limited to vaginal bleeding, contractions, leaking of fluid and fetal movement were reviewed in detail with the patient. Please refer to After Visit Summary for other counseling recommendations.  Return in about 1 week (around 02/11/2017) for OB Visit ([place]   UgVerita SchneidersMD

## 2017-02-05 LAB — COMPREHENSIVE METABOLIC PANEL
A/G RATIO: 1.3 (ref 1.2–2.2)
ALBUMIN: 3.4 g/dL — AB (ref 3.5–5.5)
ALT: 41 IU/L — ABNORMAL HIGH (ref 0–32)
AST: 31 IU/L (ref 0–40)
Alkaline Phosphatase: 115 IU/L (ref 39–117)
BILIRUBIN TOTAL: 0.3 mg/dL (ref 0.0–1.2)
BUN / CREAT RATIO: 24 — AB (ref 9–23)
BUN: 8 mg/dL (ref 6–20)
CHLORIDE: 105 mmol/L (ref 96–106)
CO2: 17 mmol/L — ABNORMAL LOW (ref 20–29)
Calcium: 9 mg/dL (ref 8.7–10.2)
Creatinine, Ser: 0.34 mg/dL — ABNORMAL LOW (ref 0.57–1.00)
GFR, EST AFRICAN AMERICAN: 174 mL/min/{1.73_m2} (ref >59)
GFR, EST NON AFRICAN AMERICAN: 151 mL/min/{1.73_m2} (ref >59)
Globulin, Total: 2.6 g/dL (ref 1.5–4.5)
Glucose: 114 mg/dL — ABNORMAL HIGH (ref 65–99)
POTASSIUM: 3.6 mmol/L (ref 3.5–5.2)
Sodium: 140 mmol/L (ref 134–144)
TOTAL PROTEIN: 6 g/dL (ref 6.0–8.5)

## 2017-02-05 LAB — BILE ACIDS, TOTAL: Bile Acids Total: 22.6 umol/L (ref 4.7–24.5)

## 2017-02-10 ENCOUNTER — Encounter: Payer: Self-pay | Admitting: Obstetrics & Gynecology

## 2017-02-10 ENCOUNTER — Telehealth: Payer: Self-pay | Admitting: Obstetrics & Gynecology

## 2017-02-10 ENCOUNTER — Encounter (HOSPITAL_COMMUNITY): Payer: Self-pay

## 2017-02-10 DIAGNOSIS — K831 Obstruction of bile duct: Secondary | ICD-10-CM | POA: Insufficient documentation

## 2017-02-10 DIAGNOSIS — O26619 Liver and biliary tract disorders in pregnancy, unspecified trimester: Principal | ICD-10-CM

## 2017-02-10 MED ORDER — URSODIOL 500 MG PO TABS
500.0000 mg | ORAL_TABLET | Freq: Two times a day (BID) | ORAL | 3 refills | Status: AC
Start: 2017-02-10 — End: ?

## 2017-02-10 MED ORDER — HYDROXYZINE PAMOATE 50 MG PO CAPS: 100.0000 mg | ORAL_CAPSULE | Freq: Three times a day (TID) | ORAL | 3 refills | Status: AC | PRN

## 2017-02-10 NOTE — Telephone Encounter (Signed)
Patient called back and I discussed results with her and informed her to pick up Ursodiol and Atarax prescriptions from [facility] pharmacy. She does report worsening diffuse itching.  Informed her to come to appt at 12:40 on 10/18 for her BPP prior to OB visit. Growth scan will be scheduled. Also informed her of need for delivery at 37 weeks, RCS and BTS will be moved up.   Jaynie Collins, MD, FACOG Attending Obstetrician & Gynecologist, [facility] for [employer], [facility]

## 2017-02-10 NOTE — Addendum Note (Signed)
Addended by: Jaynie Collins A on: 02/10/2017 01:51 PM   Modules accepted: Orders

## 2017-02-10 NOTE — Telephone Encounter (Signed)
Faculty Practice OB/GYN Attending Phone Call Documentation   Called to discuss recent results with patient, there was no answer and I left her a voicemail to call office back.  Lab tests showed she has cholestasis of pregnancy with bile acid level of 22.6, slightly elevated ALT at 41. Needs weekly BPP/NST and needs to pick up Ursodiol prescription (called to [facility] pharmacy to get 430b pricing).   Also needs MFM growth scan soon and rescheduling of her RCS and BTS to 37 weeks.   BPP added on to her visit on 02/12/17, growth scan can be scheduled hopefully by next week at the latest.  Message sent to clinical staff and our surgical scheduler.    Jaynie Collins, MD, FACOG Attending Obstetrician & Gynecologist, [facility] for [employer], [facility]

## 2017-02-12 ENCOUNTER — Ambulatory Visit (INDEPENDENT_AMBULATORY_CARE_PROVIDER_SITE_OTHER): Payer: Self-pay | Admitting: Family Medicine

## 2017-02-12 ENCOUNTER — Ambulatory Visit (INDEPENDENT_AMBULATORY_CARE_PROVIDER_SITE_OTHER): Payer: Self-pay | Admitting: *Deleted

## 2017-02-12 ENCOUNTER — Ambulatory Visit: Payer: Self-pay

## 2017-02-12 VITALS — BP 93/52 | HR 94 | Wt 188.3 lb

## 2017-02-12 DIAGNOSIS — Z3483 Encounter for supervision of other normal pregnancy, third trimester: Secondary | ICD-10-CM

## 2017-02-12 DIAGNOSIS — O26619 Liver and biliary tract disorders in pregnancy, unspecified trimester: Principal | ICD-10-CM

## 2017-02-12 DIAGNOSIS — O34219 Maternal care for unspecified type scar from previous cesarean delivery: Secondary | ICD-10-CM

## 2017-02-12 DIAGNOSIS — K831 Obstruction of bile duct: Secondary | ICD-10-CM

## 2017-02-12 DIAGNOSIS — Z9049 Acquired absence of other specified parts of digestive tract: Secondary | ICD-10-CM

## 2017-02-12 DIAGNOSIS — Z3480 Encounter for supervision of other normal pregnancy, unspecified trimester: Secondary | ICD-10-CM

## 2017-02-12 NOTE — Progress Notes (Signed)

## 2017-02-12 NOTE — Progress Notes (Signed)
   PRENATAL VISIT NOTE  Subjective:  [name] is a 26 y.o. G4P3003 at 1944w2d being seen today for ongoing prenatal care.  She is currently monitored for the following issues for this high-risk pregnancy and has Depression with anxiety; Abdominal pain during pregnancy in first trimester; Supervision of other normal pregnancy, antepartum; History of cesarean delivery, currently pregnant; Preterm uterine contractions in third trimester, antepartum; Bacterial vaginitis; Acute appendicitis affecting pregnancy; S/P appendectomy; and Intrahepatic cholestasis of pregnancy, antepartum on her problem list.  Patient reports itching.  Contractions: Not present. Vag. Bleeding: None.  Movement: (!) Decreased. Denies leaking of fluid.   The following portions of the patient's history were reviewed and updated as appropriate: allergies, current medications, past family history, past medical history, past social history, past surgical history and problem list. Problem list updated.  Objective:   Vitals:   02/12/17 1246  BP: (!) 93/52  Pulse: 94  Weight: 188 lb 4.8 oz (85.4 kg)    Fetal Status:     Movement: (!) Decreased     General:  Alert, oriented and cooperative. Patient is in no acute distress.  Skin: Skin is warm and dry. No rash noted.   Cardiovascular: Normal heart rate noted  Respiratory: Normal respiratory effort, no problems with respiration noted  Abdomen: Soft, gravid, appropriate for gestational age.  Pain/Pressure: Present     Pelvic: Cervical exam deferred        Extremities: Normal range of motion.  Edema: None  Mental Status:  Normal mood and affect. Normal behavior. Normal judgment and thought content.   Assessment and Plan:  Pregnancy: G4P3003 at 2944w2d  1. Supervision of other normal pregnancy, antepartum FHT normal  2. S/P appendectomy Incision c/d/i  3. History of cesarean delivery, currently pregnant Repeat c/s scheduled 10/30  4. Intrahepatic cholestasis  of pregnancy, antepartum Continue Antenatal testing Delivery at 37 weeks US for growth Continue actigall BPP 10/10 today.  Preterm labor symptoms and general obstetric precautions including but not limited to vaginal bleeding, contractions, leaking of fluid and fetal movement were reviewed in detail with the patient. Please refer to After Visit Summary for other counseling recommendations.  Return in about 1 week (around 02/19/2017) for HR OB f/u, NST/BPP.   Levie HeritageJacob J Stinson, DO

## 2017-02-13 ENCOUNTER — Telehealth (HOSPITAL_COMMUNITY): Payer: Self-pay | Admitting: *Deleted

## 2017-02-13 NOTE — Telephone Encounter (Signed)
Preadmission screen  

## 2017-02-16 ENCOUNTER — Telehealth (HOSPITAL_COMMUNITY): Payer: Self-pay | Admitting: *Deleted

## 2017-02-16 ENCOUNTER — Encounter (HOSPITAL_COMMUNITY): Payer: Self-pay

## 2017-02-16 NOTE — Telephone Encounter (Signed)
Preadmission screen  

## 2017-02-19 ENCOUNTER — Other Ambulatory Visit: Payer: Self-pay | Admitting: Obstetrics & Gynecology

## 2017-02-19 ENCOUNTER — Ambulatory Visit: Payer: Self-pay | Admitting: *Deleted

## 2017-02-19 ENCOUNTER — Ambulatory Visit (HOSPITAL_COMMUNITY)
Admission: RE | Admit: 2017-02-19 | Discharge: 2017-02-19 | Disposition: A | Discharge status: Home / Self Care | Payer: Self-pay | Source: Ambulatory Visit | Attending: Obstetrics & Gynecology | Admitting: Obstetrics & Gynecology

## 2017-02-19 ENCOUNTER — Encounter (HOSPITAL_COMMUNITY): Payer: Self-pay

## 2017-02-19 ENCOUNTER — Ambulatory Visit (INDEPENDENT_AMBULATORY_CARE_PROVIDER_SITE_OTHER): Payer: Self-pay | Admitting: Obstetrics & Gynecology

## 2017-02-19 VITALS — BP 93/49 | HR 87 | Wt 192.1 lb

## 2017-02-19 DIAGNOSIS — O26619 Liver and biliary tract disorders in pregnancy, unspecified trimester: Principal | ICD-10-CM

## 2017-02-19 DIAGNOSIS — K831 Obstruction of bile duct: Secondary | ICD-10-CM | POA: Insufficient documentation

## 2017-02-19 DIAGNOSIS — Z3A37 37 weeks gestation of pregnancy: Secondary | ICD-10-CM | POA: Insufficient documentation

## 2017-02-19 DIAGNOSIS — O26613 Liver and biliary tract disorders in pregnancy, third trimester: Secondary | ICD-10-CM | POA: Insufficient documentation

## 2017-02-19 DIAGNOSIS — Z3A36 36 weeks gestation of pregnancy: Secondary | ICD-10-CM

## 2017-02-19 DIAGNOSIS — Z9049 Acquired absence of other specified parts of digestive tract: Secondary | ICD-10-CM

## 2017-02-19 DIAGNOSIS — O34219 Maternal care for unspecified type scar from previous cesarean delivery: Secondary | ICD-10-CM

## 2017-02-19 DIAGNOSIS — Z3480 Encounter for supervision of other normal pregnancy, unspecified trimester: Secondary | ICD-10-CM

## 2017-02-19 NOTE — Progress Notes (Signed)
Pt reports having white vaginal discharge that looks like cheese. She denies itching or odor. Pt had appendectomy on 10/5 due to acute appendicitis.  She has not had post op follow up with the surgeon. US for growth and BPP today @ 1045.  Rpt C/S scheduled on 10/30.

## 2017-02-19 NOTE — Patient Instructions (Signed)
Cesarean Delivery °Cesarean birth, or cesarean delivery, is the surgical delivery of a baby through an incision in the abdomen and the uterus. This may be referred to as a C-section. This procedure may be scheduled ahead of time, or it may be done in an emergency situation. °Tell a health care provider about: °· Any allergies you have. °· All medicines you are taking, including vitamins, herbs, eye drops, creams, and over-the-counter medicines. °· Any problems you or family members have had with anesthetic medicines. °· Any blood disorders you have. °· Any surgeries you have had. °· Any medical conditions you have. °· Whether you or any members of your family have a history of deep vein thrombosis (DVT) or pulmonary embolism (PE). °What are the risks? °Generally, this is a safe procedure. However, problems may occur, including: °· Infection. °· Bleeding. °· Allergic reactions to medicines. °· Damage to other structures or organs. °· Blood clots. °· Injury to your baby. ° °What happens before the procedure? °· Follow instructions from your health care provider about eating or drinking restrictions. °· Follow instructions from your health care provider about bathing before your procedure to help reduce your risk of infection. °· If you know that you are going to have a cesarean delivery, do not shave your pubic area. Shaving before the procedure may increase your risk of infection. °· Ask your health care provider about: °? Changing or stopping your regular medicines. This is especially important if you are taking diabetes medicines or blood thinners. °? Your pain management plan. This is especially important if you plan to breastfeed your baby. °? How long you will be in the hospital after the procedure. °? Any concerns you may have about receiving blood products if you need them during the procedure. °? Cord blood banking, if you plan to collect your baby’s umbilical cord blood. °· You may also want to ask your  health care provider: °? Whether you will be able to hold or breastfeed your baby while you are still in the operating room. °? Whether your baby can stay with you immediately after the procedure and during your recovery. °? Whether a family member or a person of your choice can go with you into the operating room and stay with you during the procedure, immediately after the procedure, and during your recovery. °· Plan to have someone drive you home when you are discharged from the hospital. °What happens during the procedure? °· Fetal monitors will be placed on your abdomen to monitor your heart rate and your baby's heart rate. °· Depending on the reason for your cesarean delivery, you may have a physical exam or additional testing, such as an ultrasound. °· An IV tube will be inserted into one of your veins. °· You may have your blood or urine tested. °· You will be given antibiotic medicine to help prevent infection. °· You may be given a special warming gown to wear to keep your temperature stable. °· Hair may be removed from your pubic area. °· The skin of your pubic area and lower abdomen will be cleaned with a germ-killing solution (antiseptic). °· A catheter may be inserted into your bladder through your urethra. This drains your urine during the procedure. °· You may be given one or more of the following: °? A medicine to numb the area (local anesthetic). °? A medicine to make you fall asleep (general anesthetic). °? A medicine (regional anesthetic) that is injected into your back or through a small   thin tube placed in your back (spinal anesthetic or epidural anesthetic). This numbs everything below the injection site and allows you to stay awake during your procedure. If this makes you feel nauseous, tell your health care provider. Medicines will be available to help reduce any nausea you may feel. °· An incision will be made in your abdomen, and then in your uterus. °· If you are awake during your  procedure, you may feel tugging and pulling in your abdomen, but you should not feel pain. If you feel pain, tell your health care provider immediately. °· Your baby will be removed from your uterus. You may feel more pressure or pushing while this happens. °· Immediately after birth, your baby will be dried and kept warm. You may be able to hold and breastfeed your baby. The umbilical cord may be clamped and cut during this time. °· Your placenta will be removed from your uterus. °· Your incisions will be closed with stitches (sutures). Staples, skin glue, or adhesive strips may also be applied to the incision in your abdomen. °· Bandages (dressings) will be placed over the incision in your abdomen. °The procedure may vary among health care providers and hospitals. °What happens after the procedure? °· Your blood pressure, heart rate, breathing rate, and blood oxygen level will be monitored often until the medicines you were given have worn off. °· You may continue to receive fluids and medicines through an IV tube. °· You will have some pain. Medicines will be available to help control your pain. °· To help prevent blood clots: °? You may be given medicines. °? You may have to wear compression stockings or devices. °? You will be encouraged to walk around when you are able. °· Hospital staff will encourage and support bonding with your baby. Your hospital may allow you and your baby to stay in the same room (rooming in) during your hospital stay to encourage successful breastfeeding. °· You may be encouraged to cough and breathe deeply often. This helps to prevent lung problems. °· If you have a catheter draining your urine, it will be removed as soon as possible after your procedure. °This information is not intended to replace advice given to you by your health care provider. Make sure you discuss any questions you have with your health care provider. °Document Released: 04/14/2005 Document Revised: 09/20/2015  Document Reviewed: 01/23/2015 °Elsevier Interactive Patient Education © 2017 [employer] ° °

## 2017-02-19 NOTE — Progress Notes (Signed)
   PRENATAL VISIT NOTE  Subjective:  [name] is a 26 y.o. G4P3003 at 6913w2d being seen today for ongoing prenatal care.  She is currently monitored for the following issues for this high-risk pregnancy and has Depression with anxiety; Abdominal pain during pregnancy in first trimester; Supervision of other normal pregnancy, antepartum; History of cesarean delivery, currently pregnant; Preterm uterine contractions in third trimester, antepartum; Bacterial vaginitis; Acute appendicitis affecting pregnancy; S/P appendectomy; and Intrahepatic cholestasis of pregnancy, antepartum on her problem list.  Patient reports no complaints.  Contractions: Irregular. Vag. Bleeding: None.  Movement: Present. Denies leaking of fluid.   The following portions of the patient's history were reviewed and updated as appropriate: allergies, current medications, past family history, past medical history, past social history, past surgical history and problem list. Problem list updated.  Objective:   Vitals:   02/19/17 0953  BP: (!) 93/49  Pulse: 87  Weight: 192 lb 1.6 oz (87.1 kg)    Fetal Status: Fetal Heart Rate (bpm): NST   Movement: Present     General:  Alert, oriented and cooperative. Patient is in no acute distress.  Skin: Skin is warm and dry. No rash noted.   Cardiovascular: Normal heart rate noted  Respiratory: Normal respiratory effort, no problems with respiration noted  Abdomen: Soft, gravid, appropriate for gestational age.  Pain/Pressure: Present     Pelvic: Cervical exam deferred        Extremities: Normal range of motion.  Edema: None  Mental Status:  Normal mood and affect. Normal behavior. Normal judgment and thought content.   Assessment and Plan:  Pregnancy: G4P3003 at 4013w2d  1. Supervision of other normal pregnancy, antepartum S/p appendectomy 10/5, incision healing well  2. Intrahepatic cholestasis of pregnancy, antepartum Scheduled for delivery at 37 week, fetal  surveillance is good  3. History of cesarean delivery, currently pregnant   4. S/P appendectomy Did well postoperatively  Preterm labor symptoms and general obstetric precautions including but not limited to vaginal bleeding, contractions, leaking of fluid and fetal movement were reviewed in detail with the patient. Please refer to After Visit Summary for other counseling recommendations.  Return in about 5 weeks (around 03/26/2017) for PP visit.  C/S on 10/30.Scheryl Newton.   [name] Balcom, MD

## 2017-02-20 ENCOUNTER — Other Ambulatory Visit: Payer: Self-pay

## 2017-02-23 ENCOUNTER — Encounter (HOSPITAL_COMMUNITY)
Admission: RE | Admit: 2017-02-23 | Discharge: 2017-02-23 | Disposition: A | Discharge status: Home / Self Care | Payer: Medicaid Other | Source: Ambulatory Visit | Attending: Family Medicine | Admitting: Family Medicine

## 2017-02-23 LAB — CBC
HCT: 32.6 % — ABNORMAL LOW (ref 36.0–46.0)
Hemoglobin: 11.2 g/dL — ABNORMAL LOW (ref 12.0–15.0)
MCH: 28.6 pg (ref 26.0–34.0)
MCHC: 34.4 g/dL (ref 30.0–36.0)
MCV: 83.4 fL (ref 78.0–100.0)
Platelets: 241 10*3/uL (ref 150–400)
RBC: 3.91 MIL/uL (ref 3.87–5.11)
RDW: 13.5 % (ref 11.5–15.5)
WBC: 8.5 10*3/uL (ref 4.0–10.5)

## 2017-02-23 LAB — RAPID HIV SCREEN (HIV 1/2 AB+AG)
HIV 1/2 ANTIBODIES: NONREACTIVE
HIV-1 P24 ANTIGEN - HIV24: NONREACTIVE

## 2017-02-23 LAB — TYPE AND SCREEN
ABO/RH(D): O POS
Antibody Screen: NEGATIVE

## 2017-02-23 NOTE — Patient Instructions (Signed)
[name]  02/23/2017   Your procedure is scheduled on:  02/24/2017  [facility] at 1:00 PM.  Pick up the phone at the desk and dial [phone]  Call this number if you have problems the morning of surgery:[phone]  Remember:   Do not eat food:After Midnight.  Do not drink clear liquids: 6 Hours before arrival.  Take these medicines the morning of surgery with A SIP OF WATER: take actigall as usual.  Do not take any other medicines   Do not wear jewelry, make-up or nail polish.  Do not wear lotions, powders, or perfumes. Do not wear deodorant.  Do not shave 48 hours prior to surgery.  Do not bring valuables to the hospital.  [facility] is not   responsible for any belongings or valuables brought to the hospital.  Contacts, dentures or bridgework may not be worn into surgery.  Leave suitcase in the car. After surgery it may be brought to your room.  For patients admitted to the hospital, checkout time is 11:00 AM the day of              discharge.    N/A   Please read over the following fact sheets that you were given:   Surgical Site Infection Prevention

## 2017-02-24 ENCOUNTER — Inpatient Hospital Stay (HOSPITAL_COMMUNITY)
Admission: RE | Admit: 2017-02-24 | Discharge: 2017-02-26 | DRG: 783 | Disposition: A | Discharge status: Home / Self Care | Payer: Medicaid Other | Source: Ambulatory Visit | Attending: Obstetrics and Gynecology | Admitting: Obstetrics and Gynecology

## 2017-02-24 ENCOUNTER — Inpatient Hospital Stay (HOSPITAL_COMMUNITY): Payer: Medicaid Other

## 2017-02-24 ENCOUNTER — Encounter (HOSPITAL_COMMUNITY)
Admission: RE | Disposition: A | Discharge status: Home / Self Care | Payer: Self-pay | Source: Ambulatory Visit | Attending: Obstetrics and Gynecology

## 2017-02-24 ENCOUNTER — Encounter (HOSPITAL_COMMUNITY): Payer: Self-pay | Admitting: *Deleted

## 2017-02-24 DIAGNOSIS — O99344 Other mental disorders complicating childbirth: Secondary | ICD-10-CM | POA: Diagnosis present

## 2017-02-24 DIAGNOSIS — Z302 Encounter for sterilization: Secondary | ICD-10-CM

## 2017-02-24 DIAGNOSIS — O34219 Maternal care for unspecified type scar from previous cesarean delivery: Secondary | ICD-10-CM | POA: Diagnosis present

## 2017-02-24 DIAGNOSIS — Z3A37 37 weeks gestation of pregnancy: Secondary | ICD-10-CM

## 2017-02-24 DIAGNOSIS — O9962 Diseases of the digestive system complicating childbirth: Secondary | ICD-10-CM | POA: Diagnosis not present

## 2017-02-24 DIAGNOSIS — O26619 Liver and biliary tract disorders in pregnancy, unspecified trimester: Secondary | ICD-10-CM

## 2017-02-24 DIAGNOSIS — O34211 Maternal care for low transverse scar from previous cesarean delivery: Principal | ICD-10-CM | POA: Diagnosis present

## 2017-02-24 DIAGNOSIS — K831 Obstruction of bile duct: Secondary | ICD-10-CM | POA: Diagnosis present

## 2017-02-24 DIAGNOSIS — F418 Other specified anxiety disorders: Secondary | ICD-10-CM | POA: Diagnosis present

## 2017-02-24 DIAGNOSIS — Z98891 History of uterine scar from previous surgery: Secondary | ICD-10-CM

## 2017-02-24 DIAGNOSIS — O2662 Liver and biliary tract disorders in childbirth: Secondary | ICD-10-CM | POA: Diagnosis present

## 2017-02-24 LAB — CBC
HEMATOCRIT: 33 % — AB (ref 36.0–46.0)
HEMOGLOBIN: 10.9 g/dL — AB (ref 12.0–15.0)
MCH: 27.3 pg (ref 26.0–34.0)
MCHC: 33 g/dL (ref 30.0–36.0)
MCV: 82.5 fL (ref 78.0–100.0)
Platelets: 234 10*3/uL (ref 150–400)
RBC: 4 MIL/uL (ref 3.87–5.11)
RDW: 13.4 % (ref 11.5–15.5)
WBC: 16.5 10*3/uL — ABNORMAL HIGH (ref 4.0–10.5)

## 2017-02-24 LAB — CREATININE, SERUM
CREATININE: 0.36 mg/dL — AB (ref 0.44–1.00)
GFR calc Af Amer: >60 mL/min (ref >60)
GFR calc non Af Amer: >60 mL/min (ref >60)

## 2017-02-24 LAB — RPR: RPR: NONREACTIVE

## 2017-02-24 SURGERY — Surgical Case
Anesthesia: Spinal | Laterality: Bilateral

## 2017-02-24 MED ORDER — ACETAMINOPHEN 500 MG PO TABS
1000.0000 mg | ORAL_TABLET | Freq: Four times a day (QID) | ORAL | Status: AC
  Administered 2017-02-25 (×3): 1000 mg via ORAL
  Filled 2017-02-24 (×3): qty 2

## 2017-02-24 MED ORDER — BUPIVACAINE IN DEXTROSE 0.75-8.25 % IT SOLN
INTRATHECAL | Status: AC
  Filled 2017-02-24: qty 2

## 2017-02-24 MED ORDER — METOCLOPRAMIDE HCL 5 MG/ML IJ SOLN
INTRAMUSCULAR | Status: DC | PRN
  Administered 2017-02-24 (×2): 5 mg via INTRAVENOUS

## 2017-02-24 MED ORDER — SIMETHICONE 80 MG PO CHEW: 80.0000 mg | CHEWABLE_TABLET | ORAL | Status: DC | PRN

## 2017-02-24 MED ORDER — KETOROLAC TROMETHAMINE 30 MG/ML IJ SOLN: 30.0000 mg | Freq: Four times a day (QID) | INTRAMUSCULAR | Status: AC | PRN

## 2017-02-24 MED ORDER — LACTATED RINGERS IV SOLN
INTRAVENOUS | Status: DC
  Administered 2017-02-24 (×3): via INTRAVENOUS

## 2017-02-24 MED ORDER — SOD CITRATE-CITRIC ACID 500-334 MG/5ML PO SOLN
ORAL | Status: AC
  Filled 2017-02-24: qty 15

## 2017-02-24 MED ORDER — DEXAMETHASONE SODIUM PHOSPHATE 4 MG/ML IJ SOLN
INTRAMUSCULAR | Status: DC | PRN
  Administered 2017-02-24: 4 mg via INTRAVENOUS

## 2017-02-24 MED ORDER — DIBUCAINE 1 % RE OINT: 1.0000 "application " | TOPICAL_OINTMENT | RECTAL | Status: DC | PRN

## 2017-02-24 MED ORDER — NALBUPHINE HCL 10 MG/ML IJ SOLN
INTRAMUSCULAR | Status: AC
  Filled 2017-02-24: qty 1

## 2017-02-24 MED ORDER — CEFAZOLIN SODIUM-DEXTROSE 2-4 GM/100ML-% IV SOLN: 2.0000 g | INTRAVENOUS | Status: DC

## 2017-02-24 MED ORDER — IBUPROFEN 600 MG PO TABS
600.0000 mg | ORAL_TABLET | Freq: Four times a day (QID) | ORAL | Status: DC
  Administered 2017-02-25 – 2017-02-26 (×6): 600 mg via ORAL
  Filled 2017-02-24 (×6): qty 1

## 2017-02-24 MED ORDER — NALBUPHINE HCL 10 MG/ML IJ SOLN: 5.0000 mg | INTRAMUSCULAR | Status: DC | PRN

## 2017-02-24 MED ORDER — OXYCODONE HCL 5 MG PO TABS: 10.0000 mg | ORAL_TABLET | ORAL | Status: DC | PRN

## 2017-02-24 MED ORDER — TETANUS-DIPHTH-ACELL PERTUSSIS 5-2.5-18.5 LF-MCG/0.5 IM SUSP: 0.5000 mL | Freq: Once | INTRAMUSCULAR | Status: DC

## 2017-02-24 MED ORDER — CEFAZOLIN SODIUM-DEXTROSE 2-4 GM/100ML-% IV SOLN
2.0000 g | INTRAVENOUS | Status: AC
  Administered 2017-02-24: 2 g via INTRAVENOUS
  Filled 2017-02-24: qty 100

## 2017-02-24 MED ORDER — METOCLOPRAMIDE HCL 5 MG/ML IJ SOLN
INTRAMUSCULAR | Status: AC
  Filled 2017-02-24: qty 2

## 2017-02-24 MED ORDER — LACTATED RINGERS IV SOLN
INTRAVENOUS | Status: DC
  Administered 2017-02-25: 03:00:00 via INTRAVENOUS

## 2017-02-24 MED ORDER — SCOPOLAMINE 1 MG/3DAYS TD PT72
1.0000 | MEDICATED_PATCH | Freq: Once | TRANSDERMAL | Status: DC
  Administered 2017-02-24: 1.5 mg via TRANSDERMAL

## 2017-02-24 MED ORDER — ALBUTEROL SULFATE (2.5 MG/3ML) 0.083% IN NEBU: 3.0000 mL | INHALATION_SOLUTION | Freq: Four times a day (QID) | RESPIRATORY_TRACT | Status: DC | PRN

## 2017-02-24 MED ORDER — PROMETHAZINE HCL 25 MG/ML IJ SOLN: 6.2500 mg | INTRAMUSCULAR | Status: DC | PRN

## 2017-02-24 MED ORDER — ONDANSETRON HCL 4 MG/2ML IJ SOLN
INTRAMUSCULAR | Status: DC | PRN
  Administered 2017-02-24: 4 mg via INTRAVENOUS

## 2017-02-24 MED ORDER — NALOXONE HCL 0.4 MG/ML IJ SOLN: 0.4000 mg | INTRAMUSCULAR | Status: DC | PRN

## 2017-02-24 MED ORDER — SENNOSIDES-DOCUSATE SODIUM 8.6-50 MG PO TABS
2.0000 | ORAL_TABLET | ORAL | Status: DC
  Administered 2017-02-25 (×2): 2 via ORAL
  Filled 2017-02-24 (×2): qty 2

## 2017-02-24 MED ORDER — DIPHENHYDRAMINE HCL 25 MG PO CAPS: 25.0000 mg | ORAL_CAPSULE | Freq: Four times a day (QID) | ORAL | Status: DC | PRN

## 2017-02-24 MED ORDER — OXYTOCIN 10 UNIT/ML IJ SOLN
INTRAVENOUS | Status: DC | PRN
  Administered 2017-02-24: 40 [IU] via INTRAVENOUS

## 2017-02-24 MED ORDER — NALBUPHINE HCL 10 MG/ML IJ SOLN: 5.0000 mg | Freq: Once | INTRAMUSCULAR | Status: AC | PRN

## 2017-02-24 MED ORDER — BUPIVACAINE IN DEXTROSE 0.75-8.25 % IT SOLN
INTRATHECAL | Status: DC | PRN
  Administered 2017-02-24: 10.5 mg via INTRATHECAL

## 2017-02-24 MED ORDER — SCOPOLAMINE 1 MG/3DAYS TD PT72
MEDICATED_PATCH | TRANSDERMAL | Status: AC
  Filled 2017-02-24: qty 1

## 2017-02-24 MED ORDER — SIMETHICONE 80 MG PO CHEW
80.0000 mg | CHEWABLE_TABLET | Freq: Three times a day (TID) | ORAL | Status: DC
  Administered 2017-02-25 – 2017-02-26 (×4): 80 mg via ORAL
  Filled 2017-02-24 (×4): qty 1

## 2017-02-24 MED ORDER — ONDANSETRON HCL 4 MG/2ML IJ SOLN
4.0000 mg | Freq: Three times a day (TID) | INTRAMUSCULAR | Status: DC | PRN
  Administered 2017-02-24: 4 mg via INTRAVENOUS
  Filled 2017-02-24: qty 2

## 2017-02-24 MED ORDER — SIMETHICONE 80 MG PO CHEW
80.0000 mg | CHEWABLE_TABLET | ORAL | Status: DC
  Administered 2017-02-25 (×2): 80 mg via ORAL
  Filled 2017-02-24 (×2): qty 1

## 2017-02-24 MED ORDER — DIPHENHYDRAMINE HCL 25 MG PO CAPS
25.0000 mg | ORAL_CAPSULE | ORAL | Status: DC | PRN
  Administered 2017-02-25: 25 mg via ORAL
  Filled 2017-02-24: qty 1

## 2017-02-24 MED ORDER — NALOXONE HCL 2 MG/2ML IJ SOSY: 1.0000 ug/kg/h | PREFILLED_SYRINGE | INTRAMUSCULAR | Status: DC | PRN

## 2017-02-24 MED ORDER — NALBUPHINE HCL 10 MG/ML IJ SOLN
5.0000 mg | Freq: Once | INTRAMUSCULAR | Status: AC | PRN
  Administered 2017-02-24: 5 mg via SUBCUTANEOUS

## 2017-02-24 MED ORDER — SODIUM CHLORIDE 0.9 % IR SOLN
Status: DC | PRN
  Administered 2017-02-24: 1

## 2017-02-24 MED ORDER — KETOROLAC TROMETHAMINE 30 MG/ML IJ SOLN
30.0000 mg | Freq: Once | INTRAMUSCULAR | Status: DC | PRN
  Administered 2017-02-24: 30 mg via INTRAVENOUS

## 2017-02-24 MED ORDER — KETOROLAC TROMETHAMINE 30 MG/ML IJ SOLN
INTRAMUSCULAR | Status: AC
  Filled 2017-02-24: qty 1

## 2017-02-24 MED ORDER — ACETAMINOPHEN 325 MG PO TABS
650.0000 mg | ORAL_TABLET | ORAL | Status: DC | PRN
  Administered 2017-02-26: 650 mg via ORAL
  Filled 2017-02-24: qty 2

## 2017-02-24 MED ORDER — PHENYLEPHRINE 8 MG IN D5W 100 ML (0.08MG/ML) PREMIX OPTIME
INJECTION | INTRAVENOUS | Status: DC | PRN
  Administered 2017-02-24: 60 ug/min via INTRAVENOUS

## 2017-02-24 MED ORDER — LACTATED RINGERS IV SOLN
INTRAVENOUS | Status: DC | PRN
  Administered 2017-02-24: 16:00:00 via INTRAVENOUS

## 2017-02-24 MED ORDER — ENOXAPARIN SODIUM 40 MG/0.4ML ~~LOC~~ SOLN
40.0000 mg | SUBCUTANEOUS | Status: DC
  Administered 2017-02-25 – 2017-02-26 (×2): 40 mg via SUBCUTANEOUS
  Filled 2017-02-24 (×2): qty 0.4

## 2017-02-24 MED ORDER — OXYTOCIN 40 UNITS IN LACTATED RINGERS INFUSION - SIMPLE MED: 2.5000 [IU]/h | INTRAVENOUS | Status: AC

## 2017-02-24 MED ORDER — DIPHENHYDRAMINE HCL 50 MG/ML IJ SOLN: 12.5000 mg | INTRAMUSCULAR | Status: DC | PRN

## 2017-02-24 MED ORDER — MENTHOL 3 MG MT LOZG: 1.0000 | LOZENGE | OROMUCOSAL | Status: DC | PRN

## 2017-02-24 MED ORDER — WITCH HAZEL-GLYCERIN EX PADS: 1.0000 "application " | MEDICATED_PAD | CUTANEOUS | Status: DC | PRN

## 2017-02-24 MED ORDER — FENTANYL CITRATE (PF) 100 MCG/2ML IJ SOLN
INTRAMUSCULAR | Status: DC | PRN
  Administered 2017-02-24: 20 ug via INTRATHECAL

## 2017-02-24 MED ORDER — SOD CITRATE-CITRIC ACID 500-334 MG/5ML PO SOLN
30.0000 mL | ORAL | Status: AC
  Administered 2017-02-24: 30 mL via ORAL

## 2017-02-24 MED ORDER — MORPHINE SULFATE (PF) 0.5 MG/ML IJ SOLN
INTRAMUSCULAR | Status: DC | PRN
  Administered 2017-02-24: 200 mg via INTRATHECAL

## 2017-02-24 MED ORDER — COCONUT OIL OIL
1.0000 "application " | TOPICAL_OIL | Status: DC | PRN
  Administered 2017-02-25: 1 via TOPICAL
  Filled 2017-02-24: qty 120

## 2017-02-24 MED ORDER — FENTANYL CITRATE (PF) 100 MCG/2ML IJ SOLN
INTRAMUSCULAR | Status: AC
  Filled 2017-02-24: qty 2

## 2017-02-24 MED ORDER — MORPHINE SULFATE (PF) 0.5 MG/ML IJ SOLN
INTRAMUSCULAR | Status: AC
  Filled 2017-02-24: qty 10

## 2017-02-24 MED ORDER — SODIUM CHLORIDE 0.9% FLUSH: 3.0000 mL | INTRAVENOUS | Status: DC | PRN

## 2017-02-24 MED ORDER — PRENATAL MULTIVITAMIN CH
1.0000 | ORAL_TABLET | Freq: Every day | ORAL | Status: DC
  Administered 2017-02-25: 1 via ORAL
  Filled 2017-02-24: qty 1

## 2017-02-24 MED ORDER — OXYCODONE HCL 5 MG PO TABS
5.0000 mg | ORAL_TABLET | ORAL | Status: DC | PRN
  Administered 2017-02-25: 5 mg via ORAL
  Filled 2017-02-24: qty 1

## 2017-02-24 SURGICAL SUPPLY — 27 items
CLAMP CORD UMBIL (MISCELLANEOUS) IMPLANT
CONTAINER PREFILL 10% NBF 15ML (MISCELLANEOUS) IMPLANT
DRSG OPSITE POSTOP 4X10 (GAUZE/BANDAGES/DRESSINGS) ×3 IMPLANT
DURAPREP 26ML APPLICATOR (WOUND CARE) ×3 IMPLANT
ELECT REM PT RETURN 9FT ADLT (ELECTROSURGICAL) ×3
ELECTRODE REM PT RTRN 9FT ADLT (ELECTROSURGICAL) ×1 IMPLANT
EXTRACTOR VACUUM M CUP 4 TUBE (SUCTIONS) IMPLANT
EXTRACTOR VACUUM M CUP 4' TUBE (SUCTIONS)
GLOVE BIOGEL PI IND STRL 6.5 (GLOVE) ×1 IMPLANT
GLOVE BIOGEL PI IND STRL 7.0 (GLOVE) ×1 IMPLANT
GLOVE BIOGEL PI INDICATOR 6.5 (GLOVE) ×2
GLOVE BIOGEL PI INDICATOR 7.0 (GLOVE) ×2
GLOVE SURG SS PI 6.0 STRL IVOR (GLOVE) ×3 IMPLANT
GOWN STRL REUS W/TWL LRG LVL3 (GOWN DISPOSABLE) ×6 IMPLANT
KIT ABG SYR 3ML LUER SLIP (SYRINGE) IMPLANT
NEEDLE HYPO 25X5/8 SAFETYGLIDE (NEEDLE) IMPLANT
NS IRRIG 1000ML POUR BTL (IV SOLUTION) ×3 IMPLANT
PACK C SECTION WH (CUSTOM PROCEDURE TRAY) ×3 IMPLANT
PAD OB MATERNITY 4.3X12.25 (PERSONAL CARE ITEMS) ×3 IMPLANT
PENCIL SMOKE EVAC W/HOLSTER (ELECTROSURGICAL) ×3 IMPLANT
RTRCTR C-SECT PINK 25CM LRG (MISCELLANEOUS) IMPLANT
SEPRAFILM MEMBRANE 5X6 (MISCELLANEOUS) IMPLANT
SUT PLAIN 0 NONE (SUTURE) ×3 IMPLANT
SUT VIC AB 0 CT1 36 (SUTURE) ×12 IMPLANT
SUT VIC AB 4-0 KS 27 (SUTURE) ×3 IMPLANT
TOWEL OR 17X24 6PK STRL BLUE (TOWEL DISPOSABLE) ×3 IMPLANT
TRAY FOLEY BAG SILVER LF 14FR (SET/KITS/TRAYS/PACK) ×3 IMPLANT

## 2017-02-24 NOTE — Transfer of Care (Cosign Needed)
Immediate Anesthesia Transfer of Care Note  Patient: Sales executiveAdriana Newton  Procedure(s) Performed: CESAREAN SECTION WITH BILATERAL TUBAL LIGATION (Bilateral )  Patient Location: PACU  Anesthesia Type:Spinal  Level of Consciousness: awake, alert  and oriented  Airway & Oxygen Therapy: Patient Spontanous Breathing  Post-op Assessment: Report given to RN and Post -op Vital signs reviewed and stable  Post vital signs: Reviewed and stable  Last Vitals:  Vitals:   02/24/17 1315  BP: (!) 97/59  Pulse: 79  Resp: 18  Temp: 36.5 C    Last Pain:  Vitals:   02/24/17 1315  TempSrc: Oral      Patients Stated Pain Goal: 4 (02/24/17 1315)  Complications: No apparent anesthesia complications

## 2017-02-24 NOTE — Op Note (Signed)
[name] PROCEDURE DATE: 02/24/2017  PREOPERATIVE DIAGNOSIS: Intrauterine pregnancy at  5174w0d weeks gestation with cholestasis of pregnancy; patient declines vag del attempt and desire for permanent sterilization  POSTOPERATIVE DIAGNOSIS: The same  PROCEDURE:     Cesarean Section and bilateral tubal ligation using Pomeroy method  SURGEON:  Dr. Gigi GinPeggy Ioana Louks  ASSISTANT: none  INDICATIONS: [name] is a 26 y.o. [id number] at 3674w0d with cholestasis of pregnancy scheduled for cesarean section secondary to patient declines vag del attempt.  The risks of cesarean section discussed with the patient included but were not limited to: bleeding which may require transfusion or reoperation; infection which may require antibiotics; injury to bowel, bladder, ureters or other surrounding organs; injury to the fetus; need for additional procedures including hysterectomy in the event of a life-threatening hemorrhage; placental abnormalities wth subsequent pregnancies, incisional problems, thromboembolic phenomenon and other postoperative/anesthesia complications. The patient concurred with the proposed plan, giving informed written consent for the procedure.  The patient also desires permanent sterilization. Risks and benefits of procedure discussed with patient including permanence of method, bleeding, infection, injury to surrounding organs and need for additional procedures. Risk failure of 0.5-1% with increased risk of ectopic gestation if pregnancy occurs was also discussed with patient.  FINDINGS:  Viable female infant in cephalic presentation.  Apgars 9 and 9. Clear amniotic fluid.  Intact placenta, three vessel cord.  Normal uterus, fallopian tubes and ovaries bilaterally.  ANESTHESIA:    Spinal INTRAVENOUS FLUIDS:2800 ml ESTIMATED BLOOD LOSS: 868 mL ml URINE OUTPUT:  200 ml SPECIMENS: Placenta sent to L&D and portions of fallopian tubes sent to  pathology COMPLICATIONS: None  immediate  PROCEDURE IN DETAIL:  The patient received intravenous antibiotics and had sequential compression devices applied to her lower extremities while in the preoperative area.  She was then taken to the operating room where anesthesia was induced and was found to be adequate. A foley catheter was placed into her bladder and attached to Anterrio Mccleery gravity. She was then placed in a dorsal supine position with a leftward tilt, and prepped and draped in a sterile manner. After an adequate timeout was performed, a Pfannenstiel skin incision was made with scalpel and carried through to the underlying layer of fascia. The fascia was incised in the midline and this incision was extended bilaterally using the Mayo scissors. Kocher clamps were applied to the superior aspect of the fascial incision and the underlying rectus muscles were dissected off bluntly. A similar process was carried out on the inferior aspect of the facial incision. The rectus muscles were separated in the midline bluntly and the peritoneum was entered bluntly. The Alexis self-retaining retractor was introduced into the abdominal cavity. Attention was turned to the lower uterine segment where a bladder flap was created, and a transverse hysterotomy was made with a scalpel and extended bilaterally bluntly. The infant was successfully delivered and delayed cod clamping was performed for 1 minute. The cord was clamped and cut and infant was handed over to awaiting neonatology team. Uterine massage was then administered and the placenta delivered intact with three-vessel cord. The uterus was cleared of clot and debris.  The hysterotomy was closed with 0 Vicryl in a running locked fashion, and an imbricating layer was also placed with a 0 Vicryl. Overall, excellent hemostasis was noted. The pelvis copiously irrigated and cleared of all clot and debris. The patient's left fallopian tube was then identified, brought to the incision, and grasped with a  Babcock clamp. The tube was then  followed out to the fimbria. The Babcock clamp was then used to grasp the tube approximately 4 cm from the cornual region. A 3 cm segment of the tube was then ligated with free tie of plain gut suture, transected and excised. Good hemostasis was noted and the tube was returned to the abdomen. The right fallopian tube was then identified to its fimbriated end, ligated, and a 3 cm segment excised in a similar fashion. Excellent hemostasis was noted, and the tube returned to the abdomen. Hemostasis was confirmed on all surfaces.  The peritoneum and the muscles were reapproximated using 0 vicryl interrupted stitches. The fascia was then closed using 0 Vicryl in a running fashion.  The skin was closed in a subcuticular fashion using 3.0 Vicryl. The patient tolerated the procedure well. Sponge, lap, instrument and needle counts were correct x 2. She was taken to the recovery room in stable condition.    Jaiden Dinkins ConstantMD  02/24/2017 4:19 PM

## 2017-02-24 NOTE — H&P (Signed)
[name] is a 26 y.o. female  [phone] at 6627w0d presenting for scheduled repeat cesarean section with bilateral tubal ligation. [facility] 13 weeks complicated by previous cesarean section for fetal malpresentation, appendectomy earlier this month and cholestasis of pregnancy. Patient reports feeling well. She reports good fetal movement. She denies vaginal bleeding, leakage of fluid or regular contractions  OB History    Gravida Para Term Preterm AB Living   4 3 3  0   3   SAB TAB Ectopic Multiple Live Births           3     Past Medical History:  Diagnosis Date  . Asthma   . Gall stones   . Seasonal allergies    Past Surgical History:  Procedure Laterality Date  . APPENDECTOMY N/A 01/30/2017   Procedure: APPENDECTOMY;  Surgeon: Darnell LevelGerkin, Todd, MD;  Location: [facility];  Service: General;  Laterality: N/A;  . CESAREAN SECTION     C/S x 1  . CHOLECYSTECTOMY     Family History: family history includes Diabetes in her maternal grandfather. Social History:  reports that she has never smoked. She has never used smokeless tobacco. She reports that she does not drink alcohol or use drugs.     Maternal Diabetes: No Genetic Screening: Declined Maternal Ultrasounds/Referrals: Normal Fetal Ultrasounds or other Referrals:  None Maternal Substance Abuse:  No Significant Maternal Medications:  None Significant Maternal Lab Results:  None Other Comments:  None  ROS  See pertinent in HPI History   Blood pressure (!) 97/59, pulse 79, temperature 97.7 F (36.5 C), temperature source Oral, resp. rate 18, height 5\' 2"  (1.575 m), weight 195 lb (88.5 kg), last menstrual period 06/10/2016, unknown if currently breastfeeding. Exam Physical Exam  GENERAL: Well-developed, well-nourished female in no acute distress.  HEENT: Normocephalic, atraumatic. Sclerae anicteric.  NECK: Supple. Normal thyroid.  LUNGS: Clear to auscultation bilaterally.  HEART:  Regular rate and rhythm. ABDOMEN: Soft, nontender, gravid PELVIC: Not indicated EXTREMITIES: No cyanosis, clubbing, or edema, 2+ distal pulses.  Prenatal labs: ABO, Rh: --/--/O POS (10/29 1139) Antibody: NEG (10/29 1139) Rubella: 1.17 (05/15 0931) RPR: Non Reactive (10/29 1139)  HBsAg: Negative (05/15 0931)  HIV:   NR GBS:   Neg 01/29/2017  Assessment/Plan: 26 yo G4P3003 at 37 weeks with cholestasis of pregnancy here for repeat cesarean section and bilateral tubal ligation - Risks, benefits and alternatives were reviewed including but not limited to risks of bleeding, infection, damage to adjacent organs. Patient verbalized understanding - Patient also desires permanent sterilization. Risks and benefits of procedure discussed with patient including permanence of method, bleeding, infection, injury to surrounding organs and need for additional procedures. Risk failure of 0.5-1% with increased risk of ectopic gestation if pregnancy occurs was also discussed with patient. - All questions were answered. Consent signed    Latajah Thuman 02/24/2017, 2:45 PM

## 2017-02-24 NOTE — Anesthesia Procedure Notes (Signed)
Spinal  Start time: 02/24/2017 3:14 PM End time: 02/24/2017 3:18 PM Staffing Anesthesiologist: Leilani AbleHATCHETT, Milos Milligan Performed: anesthesiologist  Preanesthetic Checklist Completed: patient identified, surgical consent, pre-op evaluation, timeout performed, IV checked, risks and benefits discussed and monitors and equipment checked Spinal Block Patient position: sitting Prep: site prepped and draped and DuraPrep Patient monitoring: heart rate, cardiac monitor, continuous pulse ox and blood pressure Approach: midline Location: L3-4 Injection technique: single-shot Needle Needle type: Pencan  Needle length: 10 cm Needle insertion depth: 7 cm Assessment Sensory level: T4

## 2017-02-24 NOTE — Anesthesia Postprocedure Evaluation (Signed)
Anesthesia Post Note  Patient: [name]  Procedure(s) Performed: CESAREAN SECTION WITH BILATERAL TUBAL LIGATION (Bilateral )     Patient location during evaluation: PACU Anesthesia Type: Spinal Level of consciousness: awake Pain management: pain level controlled Vital Signs Assessment: post-procedure vital signs reviewed and stable Respiratory status: spontaneous breathing Cardiovascular status: stable Postop Assessment: no headache, no backache, spinal receding, no apparent nausea or vomiting and patient able to bend at knees Anesthetic complications: no    Last Vitals:  Vitals:   02/24/17 1715 02/24/17 1730  BP: 104/69   Pulse: 72   Resp: 18 18  Temp:    SpO2: 98%     Last Pain:  Vitals:   02/24/17 1633  TempSrc: Oral   Pain Goal: Patients Stated Pain Goal: 0 (02/24/17 1730)               Burrel Legrand JR,JOHN Susann GivensFRANKLIN

## 2017-02-24 NOTE — Anesthesia Preprocedure Evaluation (Signed)
Anesthesia Evaluation  Patient identified by MRN, date of birth, ID band Patient awake    Reviewed: Allergy & Precautions, H&P , NPO status , Patient's Chart, lab work & pertinent test results  Airway Mallampati: II  TM Distance: >3 FB Neck ROM: full    Dental no notable dental hx. (+) Teeth Intact   Pulmonary    Pulmonary exam normal breath sounds clear to auscultation       Cardiovascular negative cardio ROS Normal cardiovascular exam Rhythm:regular Rate:Normal     Neuro/Psych negative neurological ROS     GI/Hepatic negative GI ROS, Neg liver ROS,   Endo/Other  negative endocrine ROS  Renal/GU negative Renal ROS     Musculoskeletal negative musculoskeletal ROS (+)   Abdominal (+) + obese,   Peds  Hematology negative hematology ROS (+)   Anesthesia Other Findings   Reproductive/Obstetrics (+) Pregnancy                             Anesthesia Physical Anesthesia Plan  ASA: II  Anesthesia Plan: Spinal   Post-op Pain Management:    Induction:   PONV Risk Score and Plan: 3 and Ondansetron, Dexamethasone, Midazolam and Scopolamine patch - Pre-op  Airway Management Planned: Natural Airway and Nasal Cannula  Additional Equipment:   Intra-op Plan:   Post-operative Plan:   Informed Consent: I have reviewed the patients History and Physical, chart, labs and discussed the procedure including the risks, benefits and alternatives for the proposed anesthesia with the patient or authorized representative who has indicated his/her understanding and acceptance.     Plan Discussed with: CRNA and Surgeon  Anesthesia Plan Comments:         Anesthesia Quick Evaluation

## 2017-02-25 ENCOUNTER — Encounter (HOSPITAL_COMMUNITY): Payer: Self-pay | Admitting: Obstetrics and Gynecology

## 2017-02-25 LAB — CBC
HEMATOCRIT: 27.3 % — AB (ref 36.0–46.0)
HEMOGLOBIN: 9.5 g/dL — AB (ref 12.0–15.0)
MCH: 28.6 pg (ref 26.0–34.0)
MCHC: 34.8 g/dL (ref 30.0–36.0)
MCV: 82.2 fL (ref 78.0–100.0)
Platelets: 205 10*3/uL (ref 150–400)
RBC: 3.32 MIL/uL — AB (ref 3.87–5.11)
RDW: 13.5 % (ref 11.5–15.5)
WBC: 14.2 10*3/uL — AB (ref 4.0–10.5)

## 2017-02-25 MED ORDER — HYDROXYZINE HCL 25 MG PO TABS
25.0000 mg | ORAL_TABLET | Freq: Three times a day (TID) | ORAL | Status: DC | PRN
  Filled 2017-02-25: qty 1

## 2017-02-25 MED ORDER — LACTATED RINGERS IV BOLUS (SEPSIS)
500.0000 mL | Freq: Once | INTRAVENOUS | Status: AC
  Administered 2017-02-25: 500 mL via INTRAVENOUS

## 2017-02-25 NOTE — Progress Notes (Signed)
Post Operative Day 1 Subjective: no complaints, up ad lib, voiding and no flatus/bm. States appetitie is strong this morning. Denies dizziness. Finishing AM 500cc bolus of saline. States she has always had low blood pressures. Ambulating well. Mild abdominal pain. Mild itching patient attributes to cholestasis.  Objective: Blood pressure (!) 88/48, pulse 62, temperature 98 F (36.7 C), temperature source Oral, resp. rate 20, height 5\' 2"  (1.575 m), weight 88.5 kg (195 lb), last menstrual period 06/10/2016, SpO2 96 %, unknown if currently breastfeeding.  Physical Exam:  General: alert, cooperative and no distress Lochia: appropriate Uterine Fundus: firm below umbilicus. Incision: no significant drainage, no significant erythema DVT Evaluation: No evidence of DVT seen on physical exam.   Recent Labs  02/24/17 1828 02/25/17 0523  HGB 10.9* 9.5*  HCT 33.0* 27.3*    Assessment/Plan: Plan for discharge tomorrow and Breastfeeding.  Denies wanting circumcision.   LOS: 1 day   Huel CoteBrian W [facility] Center[facility]ish 02/25/2017, 7:33 AM

## 2017-02-25 NOTE — Anesthesia Postprocedure Evaluation (Signed)
Anesthesia Post Note  Patient: Sales executiveAdriana Newton  Procedure(s) Performed: CESAREAN SECTION WITH BILATERAL TUBAL LIGATION (Bilateral )     Patient location during evaluation: Mother Baby Anesthesia Type: Spinal Level of consciousness: awake and alert and oriented Pain management: satisfactory to patient Vital Signs Assessment: post-procedure vital signs reviewed and stable Respiratory status: spontaneous breathing and nonlabored ventilation Cardiovascular status: stable Postop Assessment: no headache, no backache, patient able to bend at knees, no signs of nausea or vomiting and adequate PO intake Anesthetic complications: no    Last Vitals:  Vitals:   02/25/17 0600 02/25/17 0630  BP:  (!) 88/48  Pulse:  62  Resp: 20 20  Temp: 36.7 C   SpO2: 96%     Last Pain:  Vitals:   02/25/17 0605  TempSrc:   PainSc: 4    Pain Goal: Patients Stated Pain Goal: 3 (02/25/17 0040)               Madison HickmanGREGORY,Amante Fomby

## 2017-02-25 NOTE — Progress Notes (Signed)
Notified Dr. Aggie Cosier. Lockamy of BP 89/48, and new drainage on honeycomb dsg.  Patient states her BP been low throughout her pregnancy.  She is asymptomatic.  Patient's dizziness, N/V is subsided. Patient's output 450cc in 7 hours.  No new orders at this time.

## 2017-02-25 NOTE — Progress Notes (Signed)
Dr. Rachelle HoraMoss was at the bedside when patient's orthostatic BP was checked.  Per Dr. Rachelle HoraMoss to give  500cc bolus and recheck patient's BP after ambulation to the bathroom.  BP recheck after ambulation 88/48.  Patient is asymtompmatic.

## 2017-02-25 NOTE — Progress Notes (Signed)
MOB was referred for history of depression/anxiety. * Referral screened out by Clinical Social Worker because none of the following criteria appear to apply: ~ History of anxiety/depression during this pregnancy, or of post-partum depression. ~ Diagnosis of anxiety and/or depression within last 3 years; No concerns noted in OB record. OR * MOB's symptoms currently being treated with medication and/or therapy.  Please contact the Clinical Social Worker if needs arise, by MOB request, or if MOB scores greater than 9/yes to question 10 on Edinburgh Postpartum Depression Screen.  Cruise Baumgardner Boyd-Gilyard, MSW, LCSW Clinical Social Work [phone]  

## 2017-02-25 NOTE — Addendum Note (Signed)
Addendum  created 02/25/17 [id number] by Shanon PayorGregory, Raman Featherston M, CRNA   Sign clinical note

## 2017-02-26 LAB — BIRTH TISSUE RECOVERY COLLECTION (PLACENTA DONATION)

## 2017-02-26 MED ORDER — TETANUS-DIPHTH-ACELL PERTUSSIS 5-2.5-18.5 LF-MCG/0.5 IM SUSP: 0.5000 mL | Freq: Once | INTRAMUSCULAR | 0 refills | Status: AC

## 2017-02-26 MED ORDER — OXYCODONE HCL 10 MG PO TABS: 10.0000 mg | ORAL_TABLET | Freq: Four times a day (QID) | ORAL | 0 refills | Status: DC | PRN

## 2017-02-26 MED ORDER — SENNOSIDES-DOCUSATE SODIUM 8.6-50 MG PO TABS
2.0000 | ORAL_TABLET | Freq: Two times a day (BID) | ORAL | 0 refills | Status: AC
Start: 2017-02-26 — End: ?

## 2017-02-26 MED ORDER — OXYCODONE HCL 10 MG PO TABS: 10.0000 mg | ORAL_TABLET | Freq: Four times a day (QID) | ORAL | 0 refills | Status: AC | PRN

## 2017-02-26 MED ORDER — IBUPROFEN 600 MG PO TABS: 600.0000 mg | ORAL_TABLET | Freq: Four times a day (QID) | ORAL | 1 refills | Status: AC | PRN

## 2017-02-26 NOTE — Lactation Note (Signed)
This note was copied from a baby's chart. Lactation Consultation Note Used Spanish interpreter Dominque [phone] Mom speaks some English as well. This is mom's 4th child. Mom BF her now 27 yr old for 6 months, her now 27 yr old and 27 yr old for 11 months each. Denied infections or difficulty BF. Mom is currently BF in side lying position when LC entered rm. Baby was hot and sweating on forehead. Baby had hat, gloved footies, onsie, and wrapped in blanket. Discussed over heating baby, removed hat, and opened blanket.  Discussed obtaining deep latch, everted nipple slanted after baby unlatched. Mom has coconut oil. Discussed how to obtain deep latch, chin tug, cheeks to breast.  Discussed I&O, supply and demand, engorgement, filling, cluster feeding. Mom encouraged to feed baby 8-12 times/24 hours and with feeding cues.  Mom has WIC. Has no questions. Encouraged to call for assistance if needed.  [facility] brochure given w/resources, support groups and LC services. Patient Name: [name] EAVWU'JToday's Date: 02/26/2017 Reason for consult: Initial assessment   Maternal Data Has patient been taught Hand Expression?: Yes Does the patient have breastfeeding experience prior to this delivery?: Yes  Feeding Feeding Type: Breast Fed Length of feed: 20 min  LATCH Score Latch: Repeated attempts needed to sustain latch, nipple held in mouth throughout feeding, stimulation needed to elicit sucking reflex.  Audible Swallowing: A few with stimulation  Type of Nipple: Everted at rest and after stimulation  Comfort (Breast/Nipple): Filling, red/small blisters or bruises, mild/mod discomfort  Hold (Positioning): No assistance needed to correctly position infant at breast.  LATCH Score: 7  Interventions Interventions: Breast feeding basics reviewed;Breast compression;Adjust position;Skin to skin;Support pillows;Breast massage;Position options;Hand express;Expressed milk;Coconut oil  Lactation  Tools Discussed/Used WIC Program: Yes   Consult Status Consult Status: Follow-up Date: 02/26/17 Follow-up type: In-patient    Kentavius Dettore, Diamond NickelLAURA G 02/26/2017, 2:27 AM

## 2017-02-26 NOTE — Discharge Summary (Signed)
OB Discharge Summary     Patient Name: [name] DOB: August 07, 1990 MRN: [id number]  Date of admission: 02/24/2017 Delivering MD: Catalina Antigua   Date of discharge: 02/26/2017  Admitting diagnosis: RCS UNDESIRED FERTILITY Intrauterine pregnancy: [redacted]w[redacted]d     Secondary diagnosis:  Active Problems:   History of cesarean delivery, currently pregnant   Intrahepatic cholestasis of pregnancy, antepartum   S/P cesarean section  Additional problems: s/p appendectomy, depression with anxiety     Discharge diagnosis: Term Pregnancy Delivered                                                                                                Post partum procedures:none  Augmentation: none  Complications: None  Hospital course:  Scheduled Cesarean Section  26 y.o. yo G4P4003 at [redacted]w[redacted]d was admitted to the hospital 02/24/2017 for scheduled rLTCS. Pregnancy significant for cholestasis and recent open appendectomy. The patient went for cesarean section due to Elective Repeat, and delivered a Viable infant, Membrane Rupture Time/Date: )3:47 PM ,02/24/2017   @Details  of operation can be found in separate operative Note.  Patient had an uncomplicated postpartum course. She is ambulating, tolerating a regular diet, passing flatus, and urinating well.  Patient is discharged home in stable condition on 02/26/17.                                    Physical exam  Vitals:   02/25/17 0630 02/25/17 0937 02/25/17 1901 02/26/17 0534  BP: (!) 88/48 (!) 84/34 (!) 85/39 (!) 86/50  Pulse: 62 70 80 71  Resp: 20 18 16 18   Temp:  98.4 F (36.9 C) 98.4 F (36.9 C) 98.1 F (36.7 C)  TempSrc:  Oral Axillary Oral  SpO2:  97%  100%  Weight:      Height:       General: alert, cooperative and no distress Lochia: appropriate Uterine Fundus: firm Incision: Healing well with no significant drainage, No significant erythema, Dressing is clean, dry, and intact DVT Evaluation: No evidence of DVT seen on  physical exam. No cords or calf tenderness. Labs: Lab Results  Component Value Date   WBC 14.2 (H) 02/25/2017   HGB 9.5 (L) 02/25/2017   HCT 27.3 (L) 02/25/2017   MCV 82.2 02/25/2017   PLT 205 02/25/2017   CMP Latest Ref Rng & Units 02/24/2017  Glucose 65 - 99 mg/dL -  BUN 6 - 20 mg/dL -  Creatinine 4.09 - 8.11 mg/dL 9.14(N)  Sodium 829 - 562 mmol/L -  Potassium 3.5 - 5.2 mmol/L -  Chloride 96 - 106 mmol/L -  CO2 20 - 29 mmol/L -  Calcium 8.7 - 10.2 mg/dL -  Total Protein 6.0 - 8.5 g/dL -  Total Bilirubin 0.0 - 1.2 mg/dL -  Alkaline Phos 39 - 130 IU/L -  AST 0 - 40 IU/L -  ALT 0 - 32 IU/L -    Discharge instruction: per After Visit Summary and "Baby and Me Booklet".  After visit meds:  Allergies as of 02/26/2017  Reactions   Avocado Anaphylaxis   Norco [hydrocodone-acetaminophen] Nausea Only   dizziness   Strawberry Extract Hives, Shortness Of Breath, Swelling   Fentanyl-bupivacaine-nacl Rash   Pt states epidural caused rash all over body      Medication List    TAKE these medications   albuterol 108 (90 Base) MCG/ACT inhaler Commonly known as:  PROVENTIL HFA;VENTOLIN HFA Inhale 2 puffs into the lungs every 6 (six) hours as needed for wheezing or shortness of breath.   hydrOXYzine 50 MG capsule Commonly known as:  VISTARIL Take 2 capsules (100 mg total) by mouth 3 (three) times daily as needed for itching.   ibuprofen 600 MG tablet Commonly known as:  ADVIL,MOTRIN Take 1 tablet (600 mg total) by mouth every 6 (six) hours as needed.   metoCLOPramide 10 MG tablet Commonly known as:  REGLAN Take 1 tablet (10 mg total) by mouth 3 (three) times daily before meals.   NIFEdipine 30 MG 24 hr tablet Commonly known as:  PROCARDIA-XL/ADALAT CC Take 1 tablet (30 mg total) by mouth daily. Can increase to twice a day as needed   Oxycodone HCl 10 MG Tabs Take 1 tablet (10 mg total) by mouth every 6 (six) hours as needed (pain scale > 7). What  changed:  medication strength  how much to take  when to take this  reasons to take this   prenatal multivitamin Tabs tablet Take 1 tablet by mouth daily.   promethazine 25 MG tablet Commonly known as:  PHENERGAN Take 1 tablet (25 mg total) by mouth every 6 (six) hours as needed for nausea or vomiting.   senna-docusate 8.6-50 MG tablet Commonly known as:  Senokot-S Take 2 tablets by mouth 2 (two) times daily.   Tdap 5-2.5-18.5 LF-MCG/0.5 injection Commonly known as:  BOOSTRIX Inject 0.5 mLs into the muscle once.   ursodiol 500 MG tablet Commonly known as:  ACTIGALL Take 1 tablet (500 mg total) by mouth 2 (two) times daily.            Discharge Care Instructions        Start     Ordered   02/26/17 0000  Change dressing (specify)    Comments:  Dressing change: You may shower with the dressing on. Please leave in place for 5-7 days. It will start to come off on its on. If it is still in place after 7 days you may remove all dressing and strips.   02/26/17 0755      Diet: routine diet  Activity: Advance as tolerated. Pelvic rest for 6 weeks.   Outpatient follow up:2 weeks Follow up Appt:Future Appointments Date Time Provider Department Center  03/26/2017 10:40 AM Conan Bowensavis, Kelly M, MD [facility]   Follow up Visit:No Follow-up on file.  Postpartum contraception: Nexplanon  Newborn Data: Live born female  Birth Weight: 7 lb 12.3 oz (3525 g) APGAR: 9, 9  Newborn Delivery   Birth date/time:  02/24/2017 15:47:00 Delivery type:  C-Section, Low Vertical  C-section categorization:  Repeat     Baby Feeding: Breast Disposition:home with mother   02/26/2017 Arlyce Harmanimothy Lockamy, DO  CNM attestation I have seen and examined this patient and agree with above documentation in the resident's note.   Si Raiderdriana Newton is a 26 y.o. [phone] s/p rLTCS and BTL.   Pain is well controlled.  Plan for birth control is bilateral tubal ligation.  Method of Feeding:  breast  PE:  BP (!) 86/50   Pulse 71   Temp  98.1 F (36.7 C) (Oral)   Resp 18   Ht 5\' 2"  (1.575 m)   Wt 88.5 kg (195 lb)   LMP 06/10/2016   SpO2 100%   Breastfeeding? Unknown   BMI 35.67 kg/m  Fundus firm   Recent Labs  02/24/17 1828 02/25/17 0523  HGB 10.9* 9.5*  HCT 33.0* 27.3*     Plan: discharge today - postpartum care discussed - f/u clinic in 4 weeks for postpartum visit   Cam Hai, CNM 10:27 AM 02/26/2017

## 2017-02-26 NOTE — Progress Notes (Signed)
Post Partum Day 2 Subjective: no complaints, up ad lib, voiding, tolerating PO and + flatus  Objective: Blood pressure (!) 86/50, pulse 71, temperature 98.1 F (36.7 C), temperature source Oral, resp. rate 18, height 5\' 2"  (1.575 m), weight 195 lb (88.5 kg), last menstrual period 06/10/2016, SpO2 100 %, unknown if currently breastfeeding.  Physical Exam:  General: alert, cooperative and no distress Lochia: appropriate Uterine Fundus: firm Incision: healing well, no significant drainage, no dehiscence, no significant erythema DVT Evaluation: No evidence of DVT seen on physical exam. No cords or calf tenderness.   Recent Labs  02/24/17 1828 02/25/17 0523  HGB 10.9* 9.5*  HCT 33.0* 27.3*    Assessment/Plan: Plan for discharge tomorrow, Breastfeeding and Contraception nexplanon   LOS: 2 days   Arlyce Harmanimothy Jun Rightmyer 02/26/2017, 7:41 AM

## 2017-02-26 NOTE — Discharge Instructions (Signed)
Cuidados en el postparto luego de un parto por cesárea  (Postpartum Care After Cesarean Delivery)  El período de tiempo que sigue inmediatamente al parto se conoce como puerperio.  ¿QUÉ TIPO DE ATENCIÓN MÉDICA RECIBIRÉ?  · Podría continuar recibiendo medicamentos y líquidos través de una vía intravenosa (IV) que se colocará en una de sus venas.  · Probablemente tenga colocado un tubo flexible de drenaje (catéter) que drenará la orina de la vejiga. El catéter será retirado tan pronto como sea apropiado.  · Es posible que le den una botella rociadora para que use cuando vaya al baño. Puede utilizarla hasta que se sienta cómoda limpiándose de la manera habitual. Siga los pasos a continuación para usar la botella rociadora:  ? Antes de orinar, llene la botella rociadora con agua tibia. El agua debe estar tibia. No use agua caliente.  ? Después de orinar, mientras aún está sentada en el inodoro, use la botella rociadora para enjuagar el área alrededor de la uretra y la abertura vaginal. Con esto podrá limpiar cualquier rastro de orina y sangre.  ? Puede hacer esto en lugar de secarse. Cuando comience a sanar, podrá usar la botella rociadora antes de secarse. Asegúrese de secarse suavemente.  ? Llene la botella rociadora con agua limpia cada vez que vaya al baño.  · Deberá usar apósitos sanitarios.  · Le controlarán la incisión para asegurarse de que esté cicatrizando correctamente. Le indicarán cuando es seguro retirar los puntos, las grapas o la cinta adhesiva para la piel.  ¿QUÉ PUEDO ESPERAR?  · Quizás no tenga necesidad de orinar durante varias horas después del parto.  · Sentirá algo de dolor y molestias en el abdomen. Puede tener una pequeña secreción de sangre o de líquido transparente que proviene de la incisión.  · Si está amamantando, podría tener contracciones uterinas cada vez que lo haga. Estas podrían prolongarse hasta varias semanas durante el puerperio. Las contracciones uterinas ayudan al útero a  regresar a su tamaño habitual.  · Es normal tener un poco de hemorragia vaginal (loquios) después del parto. La cantidad y apariencia de los loquios a menudo es similar a las del período menstrual la primera semana después del parto. Disminuirá gradualmente las siguientes semanas hasta convertirse en una descarga seca amarronada o amarillenta. En la mayoría de las mujeres, los loquios se detienen completamente entre 6 a 8 semanas después del parto. Los sangrados vaginales pueden variar de mujer a mujer.  · Los primeros días después del parto, podría padecer congestión mamaria. Los pechos se sentirán pesados, llenos y molestos. Las mamas también podrían latir y ponerse duras, muy tirantes, calientes y sensibles al tacto. Cuando esto ocurra, podría notar leche que se escapa de los senos. El médico puede recomendarle algunos métodos para aliviar este malestar causado por la congestión mamaria. La congestión mamaria debería desaparecer al cabo de unos días.  · Podría sentirse más deprimida o preocupada que lo habitual debido a los cambios hormonales luego del parto. Estos sentimientos no deben durar más de unos pocos días. Si no desaparecen al cabo de algunos días, hable con su médico.  ¿QUÉ CUIDADOS DEBO TENER?  · Infórmele a su médico si siente dolor o malestar.  · Beba suficiente agua para mantener la orina clara o de color amarillo pálido.  · Lávese bien las manos con agua y jabón durante al menos 20 segundos después de cambiar el apósito sanitario, usar el baño y antes de sostener o alimentar al bebé.  · Si no está amamantando, evite tocarse   mucho los senos. Al hacerlo, podrían producir más leche.  · Si se siente débil o mareada, o si siente que está a punto de desmayarse, pida ayuda antes de realizar lo siguiente:  ? Levantarse de la cama.  ? Ducharse.  · Cambie los apósitos sanitarios con frecuencia. Observe si hay cambios en el flujo, como un aumento repentino en el volumen, cambios en el color o coágulos  sanguíneos de gran tamaño. Si expulsa un coágulo sanguíneo por la vagina, guárdelo para mostrárselo a su médico. No tire la cadena sin que el médico examine el coágulo antes.  · Asegúrese de tener todas las vacunas al día. Esto la ayudará a estar protegida y a proteger al bebé de determinadas enfermedades. Podría necesitar vacunas antes de dejar el hospital.  · Si lo desea, hable con el médico acerca de los métodos de planificación familiar o control de la natalidad (métodos anticonceptivos).  ¿CÓMO PUEDO ESTABLECER LAZOS CON MI BEBÉ?  Pasar tanto tiempo como le sea posible con el bebé es sumamente importante. Durante ese tiempo, usted y su bebé pueden conocerse y desarrollar lazos. Tener al bebé con usted en la habitación le dará tiempo de conocerlo. Esto también puede hacerla sentir más cómoda para atender al bebé. Amamantar también puede ayudarla a crear lazos con el bebé.  ¿CÓMO PUEDO PLANIFICAR MI REGRESO A CASA CON EL BEBÉ?  · Asegúrese de tener instalada una butaca en el automóvil.  ? La butaca debe contar con la certificación del fabricante para asegurarse de que esté instalada en forma segura.  ? Asegúrese de que el bebé quede bien asegurado en la butaca.  · Pregúntele al médico todo lo que necesite saber sobre los cuidados de su bebé. Asegúrese de poder comunicarse con el médico en caso de que tenga preguntas luego de dejar el hospital.  Esta información no tiene como fin reemplazar el consejo del médico. Asegúrese de hacerle al médico cualquier pregunta que tenga.  Document Released: 03/31/2012 Document Revised: 08/06/2015 Document Reviewed: 03/19/2015  Elsevier Interactive Patient Education © 2018 [employer] Inc[employer]

## 2017-03-26 ENCOUNTER — Encounter: Payer: Self-pay | Admitting: Obstetrics and Gynecology

## 2017-03-26 ENCOUNTER — Ambulatory Visit (INDEPENDENT_AMBULATORY_CARE_PROVIDER_SITE_OTHER): Payer: Self-pay | Admitting: Obstetrics and Gynecology

## 2017-03-26 DIAGNOSIS — Z3480 Encounter for supervision of other normal pregnancy, unspecified trimester: Secondary | ICD-10-CM

## 2017-03-26 NOTE — Progress Notes (Signed)
Subjective:     [name] is a 26 y.o. female who presents for a postpartum visit. She is 4 weeks postpartum following a low cervical transverse Cesarean section & bilateral tubal ligation. I have fully reviewed the prenatal and intrapartum course. The delivery was at 75 gestational weeks. Outcome: repeat cesarean section, low transverse incision. Anesthesia: spinal. Postpartum course has been unremarkable. Baby's course has been remarkable for milk intolerance, infant was swithched to formula and is doing detter. Baby is feeding by both breast and bottle - Gerber Sensitive. Bleeding moderate lochia. Bowel function is normal. Bladder function is normal. Patient is not sexually active. Contraception method is tubal ligation. Postpartum depression screening: negative.  The following portions of the patient's history were reviewed and updated as appropriate: allergies, current medications, past family history, past medical history, past social history, past surgical history and problem list.  Review of Systems Pertinent items are noted in HPI.   Objective:    LMP 06/10/2016   General:  alert, cooperative and appears stated age   Breasts:  deferred  Lungs: clear to auscultation bilaterally  Heart:  regular rate and rhythm  Abdomen: soft, non-tender; bowel sounds normal; no masses,  no organomegaly and well healed pfannenstiel with 1 mm suture at right aspect, removed with suture removal kit   Vulva:  not evaluated  Vagina: not evaluated                    Assessment:    Normal postpartum exam. Pap smear normal 2017. No concerns/issues  Plan:   1. Contraception: tubal ligation 2. Reviewed methods to stopping milk production as baby is lactose intolerant and bottle feeding 3. Reviewed importance of yearly exams. Follow up as needed.  Call with any issues.   Feliz Beam, M.D. Attending [place], [employer] for [employer], [place]

## 2017-10-30 IMAGING — CT CT ABD-PELV W/ CM
1 series · 15 of 32 positions shown, 19 images · IV contrast (OMNIPAQUE)
Comparison: 11/13/2013

CLINICAL DATA: Vomiting.  Right lower quadrant pain

EXAM:
CT ABDOMEN AND PELVIS WITH CONTRAST
TECHNIQUE: Multidetector CT imaging of the abdomen and pelvis was performed
using the standard protocol following bolus administration of
intravenous contrast.
CONTRAST:  100mL 25T7ZK-H00 IOPAMIDOL (25T7ZK-H00) INJECTION 61%

[Series 5: routine abdomen/pelvis with · axial · 0.79mm/px · z∈[-432,+8]mm · 15 of 99 slices shown, 19 images]
[im 7/99  soft-tissue]
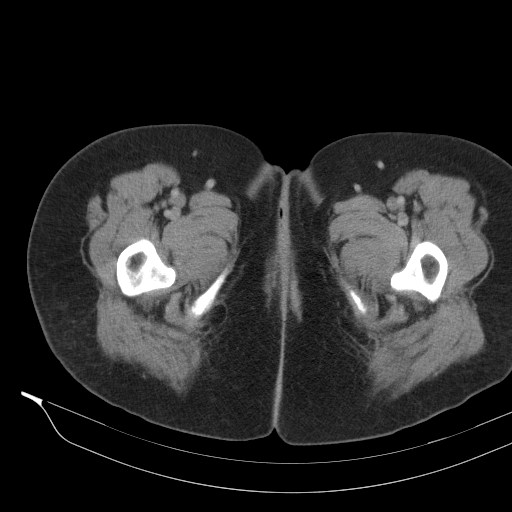
[im 7/99  bone]
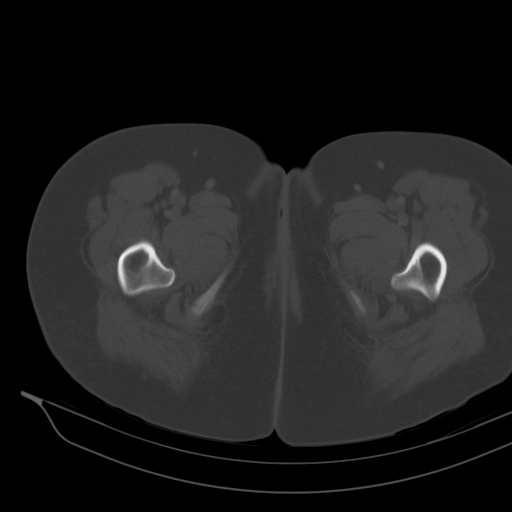
[im 13/99  soft-tissue]
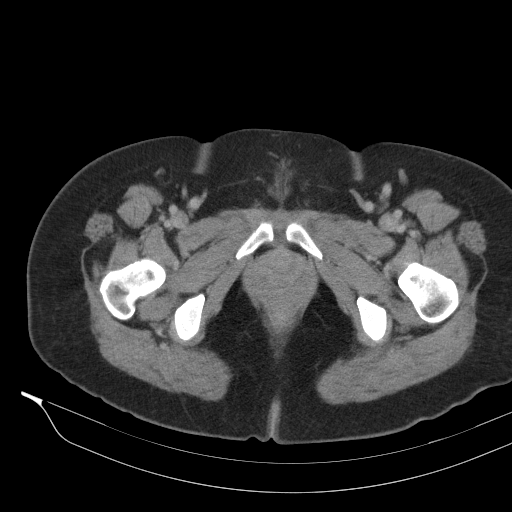
[im 19/99  soft-tissue]
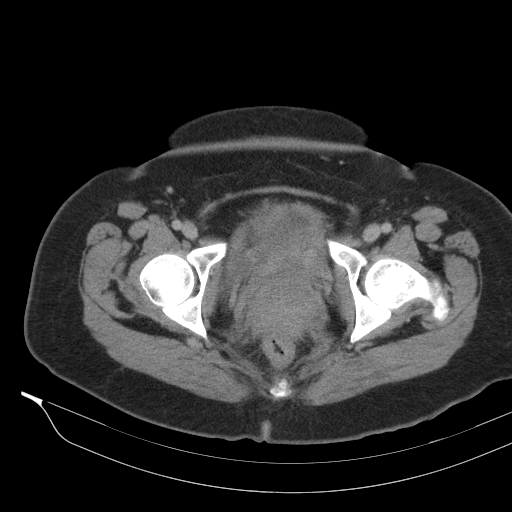
[im 29/99  soft-tissue]
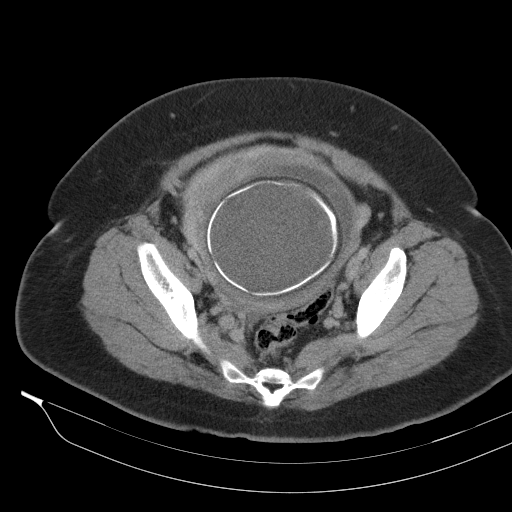
[im 35/99  soft-tissue]
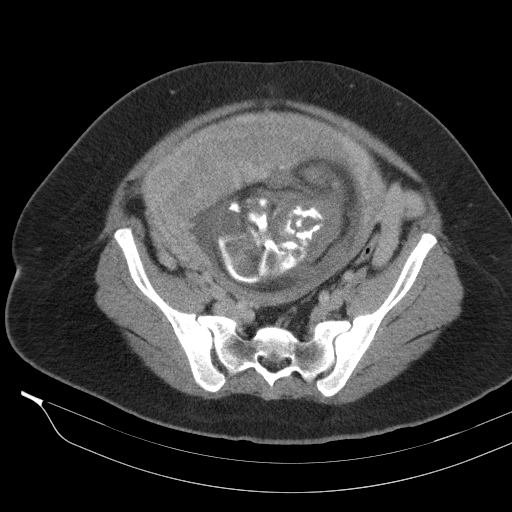
[im 42/99  soft-tissue]
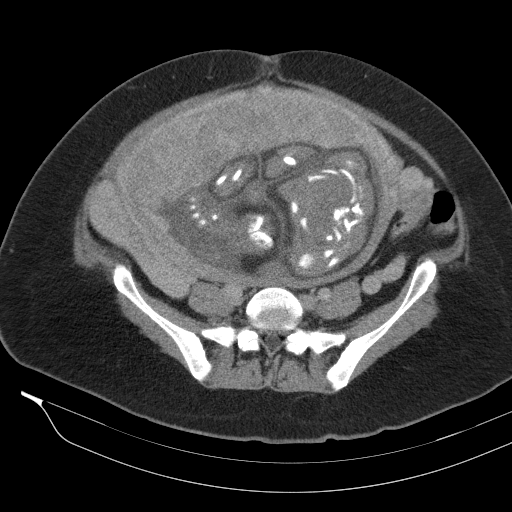
[im 51/99  soft-tissue]
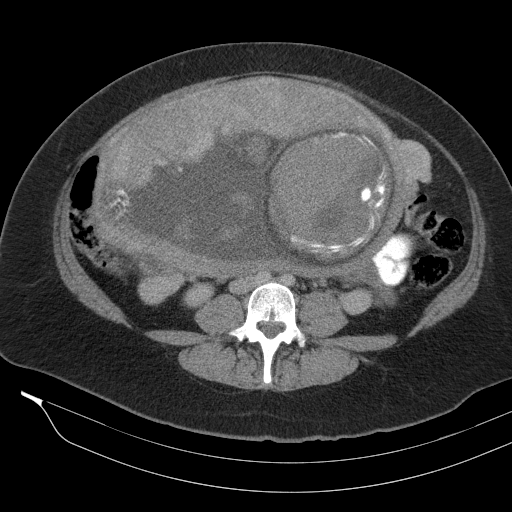
[im 57/99  soft-tissue]
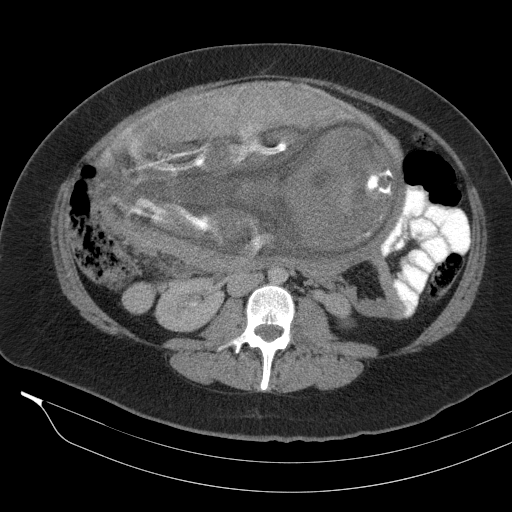
[im 64/99  soft-tissue]
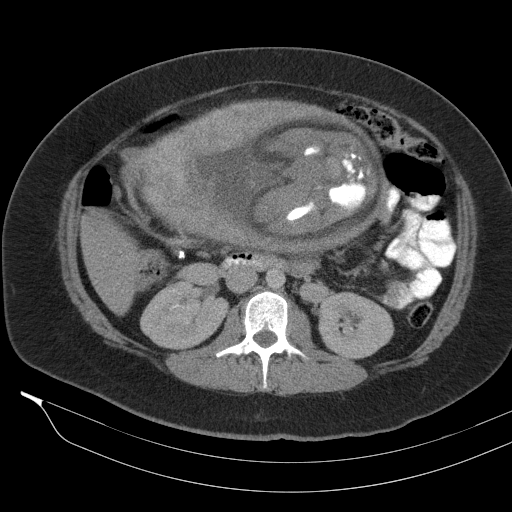
[im 64/99  bone]
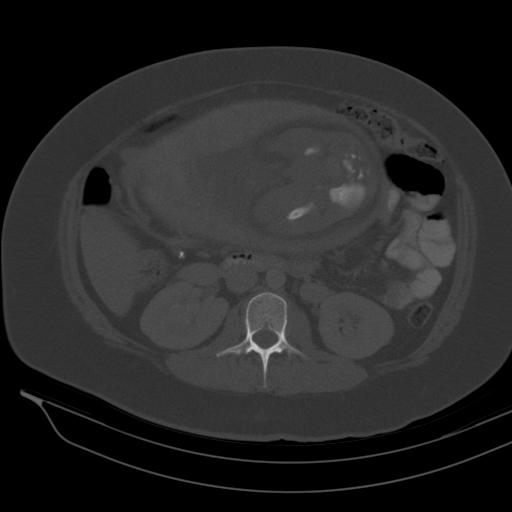
[im 70/99  soft-tissue]
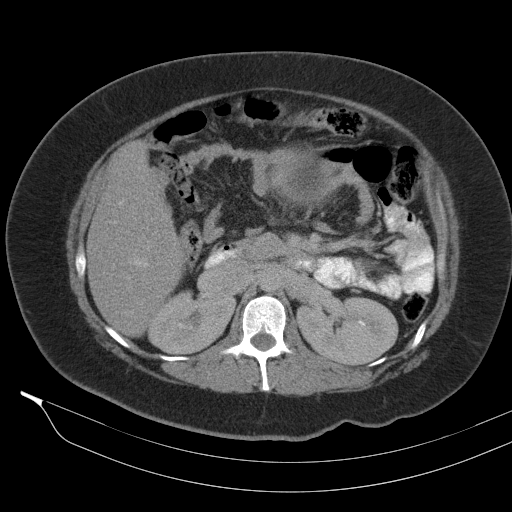
[im 80/99  soft-tissue]
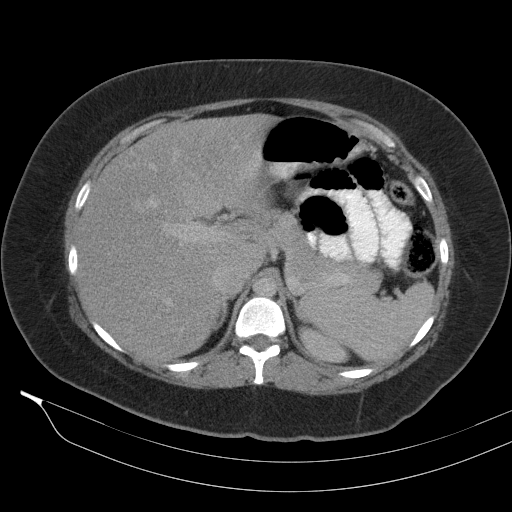
[im 86/99  soft-tissue]
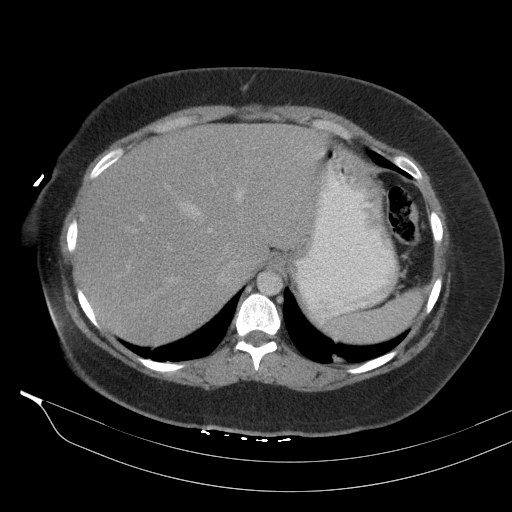
[im 86/99  lung]
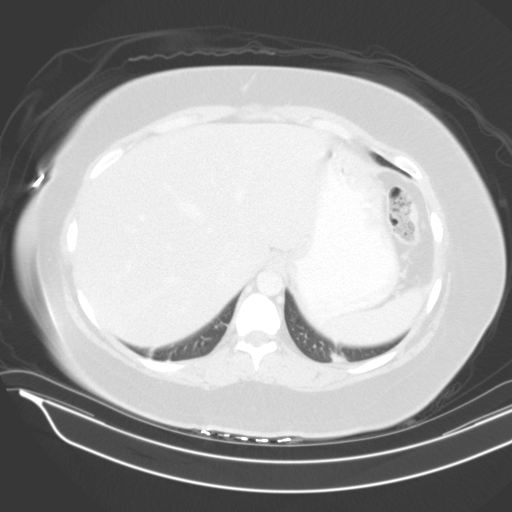
[im 89/99  lung]
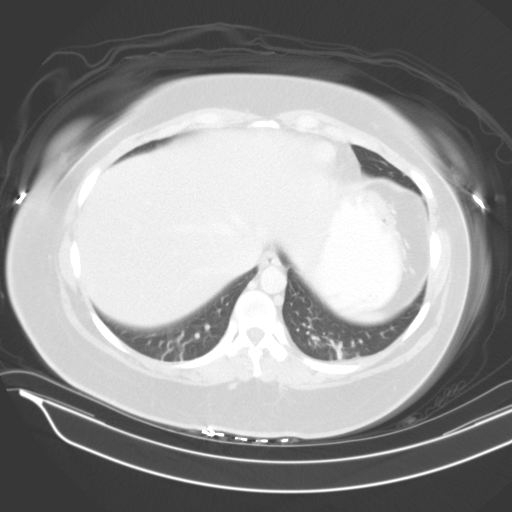
[im 92/99  soft-tissue]
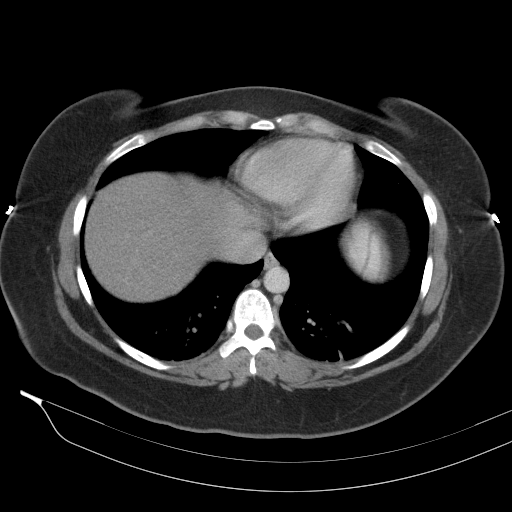
[im 92/99  lung]
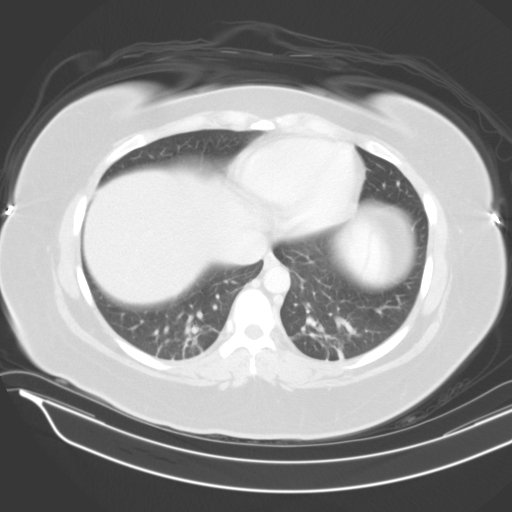
[im 95/99  lung]
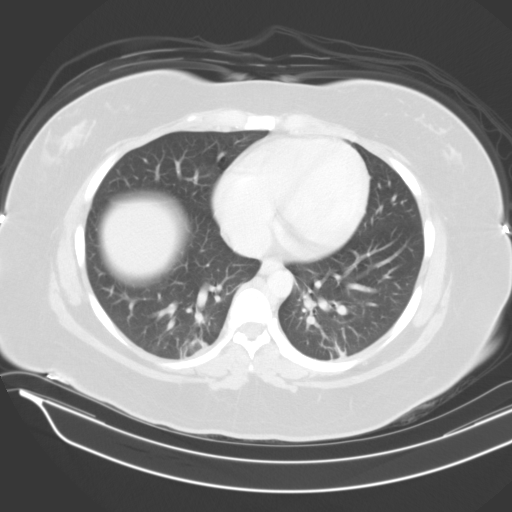

[15 of 32 positions shown; findings below may reference images not displayed]

FINDINGS: Lower chest: Mild subsegmental atelectasis within the lung bases
noted.

Hepatobiliary: No focal liver abnormality. Previous cholecystectomy.

Pancreas: Unremarkable appearance of the pancreas.

Spleen: Spleen appears normal.

Adrenals/Urinary Tract: The adrenal glands are normal. Normal
appearance of both kidneys. Urinary bladder appears collapsed.

Stomach/Bowel: The stomach is normal. The small bowel loops are
unremarkable. The appendix is visualized within the right upper
quadrant of the abdomen. The appendix is increased in caliber
measuring 2 cm and maximum diameter. There is diffuse
periappendiceal fat stranding compatible with inflammation. There is
a small foci of gas associated with the mid appendix. Cannot confirm
intraluminal location of this gas. The colon is unremarkable.

Vascular/Lymphatic: No significant vascular findings are present. No
enlarged abdominal or pelvic lymph nodes.

Reproductive: There is a gravid uterus compatible with 37 week
gestation.

Other: No free fluid or fluid collections.

Musculoskeletal: The visualized osseous structures are unremarkable.
IMPRESSION: 1. Exam positive for acute appendicitis. No free perforation and no
abscess identified. There is a small foci of gas associated with the
mid appendix for which intraluminal location cannot be confirmed.
The presence of extraluminal gas would be suggestive of perforated
appendix.
2. Gravid uterus compatible with 37 week gestation.

## 2017-12-07 ENCOUNTER — Ambulatory Visit (INDEPENDENT_AMBULATORY_CARE_PROVIDER_SITE_OTHER): Payer: Self-pay | Admitting: Physician Assistant

## 2018-02-25 ENCOUNTER — Other Ambulatory Visit: Payer: Self-pay | Admitting: Family Medicine

## 2018-08-04 ENCOUNTER — Encounter: Payer: Self-pay | Admitting: *Deleted

## 2019-09-06 ENCOUNTER — Ambulatory Visit: Payer: Self-pay | Attending: Internal Medicine

## 2023-05-07 ENCOUNTER — Other Ambulatory Visit: Payer: Self-pay
